# Patient Record
Sex: Male | Born: 1950 | Race: Black or African American | Hispanic: No | Marital: Married | Smoking: Never smoker
Health system: Southern US, Community
[De-identification: ages and names within clinical notes are randomized; demographics above are authoritative.]

## PROBLEM LIST (undated history)

## (undated) DIAGNOSIS — I10 Essential (primary) hypertension: Secondary | ICD-10-CM

## (undated) DIAGNOSIS — K219 Gastro-esophageal reflux disease without esophagitis: Secondary | ICD-10-CM

## (undated) HISTORY — PX: OTHER SURGICAL HISTORY: SHX169

## (undated) HISTORY — DX: Gastro-esophageal reflux disease without esophagitis: K21.9

## (undated) HISTORY — DX: Essential (primary) hypertension: I10

---

## 2020-07-26 ENCOUNTER — Encounter: Payer: Self-pay | Admitting: Neurology

## 2020-10-29 ENCOUNTER — Other Ambulatory Visit: Payer: Self-pay

## 2020-10-29 ENCOUNTER — Encounter: Payer: Self-pay | Admitting: Neurology

## 2020-10-29 ENCOUNTER — Other Ambulatory Visit (INDEPENDENT_AMBULATORY_CARE_PROVIDER_SITE_OTHER): Payer: Medicare PPO

## 2020-10-29 ENCOUNTER — Ambulatory Visit: Payer: Medicare PPO | Admitting: Neurology

## 2020-10-29 VITALS — BP 150/86 | HR 62 | Resp 20 | Ht 73.0 in | Wt 206.0 lb

## 2020-10-29 DIAGNOSIS — R413 Other amnesia: Secondary | ICD-10-CM

## 2020-10-29 LAB — VITAMIN B12: Vitamin B-12: 390 pg/mL (ref 211–911)

## 2020-10-29 LAB — TSH: TSH: 1.79 u[IU]/mL (ref 0.35–4.50)

## 2020-10-29 NOTE — Patient Instructions (Addendum)
It was a pleasure to see you today at our office.   Recommendations:  Meds: Follow up after the results of the tests are in  Neurocognitive evaluation at our office MRI of the brain, the office will call you to arrange you appointment Check B12 and TSH at the lab  RECOMMENDATIONS FOR ALL PATIENTS WITH MEMORY PROBLEMS: 1. Continue to exercise (Recommend 30 minutes of walking everyday, or 3 hours every week) 2. Increase social interactions - continue going to [place] and enjoy social gatherings with friends and family 3. Eat healthy, avoid fried foods and eat more fruits and vegetables 4. Maintain adequate blood pressure, blood sugar, and blood cholesterol level. Reducing the risk of stroke and cardiovascular disease also helps promoting better memory. 5. Avoid stressful situations. Live a simple life and avoid aggravations. Organize your time and prepare for the next day in anticipation. 6. Sleep well, avoid any interruptions of sleep and avoid any distractions in the bedroom that may interfere with adequate sleep quality 7. Avoid sugar, avoid sweets as there is a strong link between excessive sugar intake, diabetes, and cognitive impairment We discussed the Mediterranean diet, which has been shown to help patients reduce the risk of progressive memory disorders and reduces cardiovascular risk. This includes eating fish, eat fruits and green leafy vegetables, nuts like almonds and hazelnuts, walnuts, and also use olive oil. Avoid fast foods and fried foods as much as possible. Avoid sweets and sugar as sugar use has been linked to worsening of memory function.  There is always a concern of gradual progression of memory problems. If this is the case, then we may need to adjust level of care according to patient needs. Support, both to the patient and caregiver, should then be put into place.      You have been referred for a neuropsychological evaluation (i.e., evaluation of memory and thinking  abilities). Please bring someone with you to this appointment if possible, as it is helpful for the doctor to hear from both you and another adult who knows you well. Please bring eyeglasses and hearing aids if you wear them.    The evaluation will take approximately 3 hours and has two parts:   . The first part is a clinical interview with the neuropsychologist (Dr. Milbert Coulter or Dr. Roseanne Reno). During the interview, the neuropsychologist will speak with you and the individual you brought to the appointment.    . The second part of the evaluation is testing with the doctor's technician Annabelle Harman or Selena Batten). During the testing, the technician will ask you to remember different types of material, solve problems, and answer some questionnaires. Your family member will not be present for this portion of the evaluation.   Please note: We must reserve several hours of the neuropsychologist's time and the psychometrician's time for your evaluation appointment. As such, there is a No-Show fee of $100. If you are unable to attend any of your appointments, please contact our office as soon as possible to reschedule.    FALL PRECAUTIONS: Be cautious when walking. Scan the area for obstacles that may increase the risk of trips and falls. When getting up in the mornings, sit up at the edge of the bed for a few minutes before getting out of bed. Consider elevating the bed at the head end to avoid drop of blood pressure when getting up. Walk always in a well-lit room (use night lights in the walls). Avoid area rugs or power cords from appliances in the middle  of the walkways. Use a walker or a cane if necessary and consider physical therapy for balance exercise. Get your eyesight checked regularly.  FINANCIAL OVERSIGHT: Supervision, especially oversight when making financial decisions or transactions is also recommended.  HOME SAFETY: Consider the safety of the kitchen when operating appliances like stoves, microwave oven, and  blender. Consider having supervision and share cooking responsibilities until no longer able to participate in those. Accidents with firearms and other hazards in the house should be identified and addressed as well.   ABILITY TO BE LEFT ALONE: If patient is unable to contact 911 operator, consider using LifeLine, or when the need is there, arrange for someone to stay with patients. Smoking is a fire hazard, consider supervision or cessation. Risk of wandering should be assessed by caregiver and if detected at any point, supervision and safe proof recommendations should be instituted.  MEDICATION SUPERVISION: Inability to self-administer medication needs to be constantly addressed. Implement a mechanism to ensure safe administration of the medications.   DRIVING: Regarding driving, in patients with progressive memory problems, driving will be impaired. We advise to have someone else do the driving if trouble finding directions or if minor accidents are reported. Independent driving assessment is available to determine safety of driving.   If you are interested in the driving assessment, you can contact the following:  [employer] in [place] [phone]  [facility] [phone]  [facility] [phone]  [facility] [phone] or [phone]    Mediterranean Diet A Mediterranean diet refers to food and lifestyle choices that are based on the traditions of countries located on the [place]. This way of eating has been shown to help prevent certain conditions and improve outcomes for people who have chronic diseases, like kidney disease and heart disease. What are tips for following this plan? Lifestyle   Cook and eat meals together with your family, when possible.  Drink enough fluid to keep your urine clear or pale yellow.  Be physically active every day. This includes:  Aerobic exercise like running or  swimming.  Leisure activities like gardening, walking, or housework.  Get 7-8 hours of sleep each night.  If recommended by your health care provider, drink red wine in moderation. This means 1 glass a day for nonpregnant women and 2 glasses a day for men. A glass of wine equals 5 oz (150 mL). Reading food labels   Check the serving size of packaged foods. For foods such as rice and pasta, the serving size refers to the amount of cooked product, not dry.  Check the total fat in packaged foods. Avoid foods that have saturated fat or trans fats.  Check the ingredients list for added sugars, such as corn syrup. Shopping   At the grocery store, buy most of your food from the areas near the walls of the store. This includes:  Fresh fruits and vegetables (produce).  Grains, beans, nuts, and seeds. Some of these may be available in unpackaged forms or large amounts (in bulk).  Fresh seafood.  Poultry and eggs.  Low-fat dairy products.  Buy whole ingredients instead of prepackaged foods.  Buy fresh fruits and vegetables in-season from local farmers markets.  Buy frozen fruits and vegetables in resealable bags.  If you do not have access to quality fresh seafood, buy precooked frozen shrimp or canned fish, such as tuna, salmon, or sardines.  Buy small amounts of raw or cooked vegetables, salads, or olives from the deli or salad  bar at your store.  Stock your pantry so you always have certain foods on hand, such as olive oil, canned tuna, canned tomatoes, rice, pasta, and beans. Cooking   Cook foods with extra-virgin olive oil instead of using butter or other vegetable oils.  Have meat as a side dish, and have vegetables or grains as your main dish. This means having meat in small portions or adding small amounts of meat to foods like pasta or stew.  Use beans or vegetables instead of meat in common dishes like chili or lasagna.  Experiment with different cooking methods. Try  roasting or broiling vegetables instead of steaming or sauteing them.  Add frozen vegetables to soups, stews, pasta, or rice.  Add nuts or seeds for added healthy fat at each meal. You can add these to yogurt, salads, or vegetable dishes.  Marinate fish or vegetables using olive oil, lemon juice, garlic, and fresh herbs. Meal planning   Plan to eat 1 vegetarian meal one day each week. Try to work up to 2 vegetarian meals, if possible.  Eat seafood 2 or more times a week.  Have healthy snacks readily available, such as:  Vegetable sticks with hummus.  Greek yogurt.  Fruit and nut trail mix.  Eat balanced meals throughout the week. This includes:  Fruit: 2-3 servings a day  Vegetables: 4-5 servings a day  Low-fat dairy: 2 servings a day  Fish, poultry, or lean meat: 1 serving a day  Beans and legumes: 2 or more servings a week  Nuts and seeds: 1-2 servings a day  Whole grains: 6-8 servings a day  Extra-virgin olive oil: 3-4 servings a day  Limit red meat and sweets to only a few servings a month What are my food choices?  Mediterranean diet  Recommended  Grains: Whole-grain pasta. Brown rice. Bulgar wheat. Polenta. Couscous. Whole-wheat bread. Orpah Cobb.  Vegetables: Artichokes. Beets. Broccoli. Cabbage. Carrots. Eggplant. Green beans. Chard. Kale. Spinach. Onions. Leeks. Peas. Squash. Tomatoes. Peppers. Radishes.  Fruits: Apples. Apricots. Avocado. Berries. Bananas. Cherries. Dates. Figs. Grapes. Lemons. Melon. Oranges. Peaches. Plums. Pomegranate.  Meats and other protein foods: Beans. Almonds. Sunflower seeds. Pine nuts. Peanuts. Cod. Salmon. Scallops. Shrimp. Tuna. Tilapia. Clams. Oysters. Eggs.  Dairy: Low-fat milk. Cheese. Greek yogurt.  Beverages: Water. Red wine. Herbal tea.  Fats and oils: Extra virgin olive oil. Avocado oil. Grape seed oil.  Sweets and desserts: [place] yogurt with honey. Baked apples. Poached pears. Trail mix.  Seasoning and  other foods: Basil. Cilantro. Coriander. Cumin. Mint. Parsley. Sage. Rosemary. Tarragon. Garlic. Oregano. Thyme. Pepper. Balsalmic vinegar. Tahini. Hummus. Tomato sauce. Olives. Mushrooms.  Limit these  Grains: Prepackaged pasta or rice dishes. Prepackaged cereal with added sugar.  Vegetables: Deep fried potatoes (french fries).  Fruits: Fruit canned in syrup.  Meats and other protein foods: Beef. Pork. Lamb. Poultry with skin. Hot dogs. Tomasa Blase.  Dairy: Ice cream. Sour cream. Whole milk.  Beverages: Juice. Sugar-sweetened soft drinks. Beer. Liquor and spirits.  Fats and oils: Butter. Canola oil. Vegetable oil. Beef fat (tallow). Lard.  Sweets and desserts: Cookies. Cakes. Pies. Candy.  Seasoning and other foods: Mayonnaise. Premade sauces and marinades.  The items listed may not be a complete list. Talk with your dietitian about what dietary choices are right for you. Summary  The Mediterranean diet includes both food and lifestyle choices.  Eat a variety of fresh fruits and vegetables, beans, nuts, seeds, and whole grains.  Limit the amount of red meat and sweets that you  eat.  Talk with your health care provider about whether it is safe for you to drink red wine in moderation. This means 1 glass a day for nonpregnant women and 2 glasses a day for men. A glass of wine equals 5 oz (150 mL). This information is not intended to replace advice given to you by your health care provider. Make sure you discuss any questions you have with your health care provider. Document Released: 01/30/2016 Document Revised: 03/03/2016 Document Reviewed: 01/30/2016 Elsevier Interactive Patient Education  2017 [employer].     We have sent a referral to [facility] for your MRI and they will call you directly to schedule your appointment. They are located at [address] [facility]. If you need to contact them directly please call [phone].   Your provider has requested that you have  labwork completed today. Please go to [facility] ([id number]) on the second floor of this building before leaving the office today. You do not need to check in. If you are not called within 15 minutes please check with the front desk.

## 2020-10-29 NOTE — Progress Notes (Addendum)
Assessment/Plan:    Memory Loss  Brandon Copeland is a very pleasant 70 year old right-handed man referred to our office for evaluation of memory loss. At this time, further work-up will be needed to determine the causes of his memory loss, in order to proceed with plan. MoCA today 14/30   Recommendations are as follows . MRI of the brain to evaluate for bleeding, brain size and other abnormalities . Neurocognitive testing to further determine what causes memory changes including sleep, stress, anxiety depression . Will order B12 TSH . Discussed safety both in and out of the home. Discussed the importance of regular daily schedule with inclusion of crossword puzzles to maintain brain function.  . Monitor mood with PCP.  Marland Kitchen Continue to stay active exercising  at least 30 minutes at least 3 times a week.  . Naps should be scheduled and should be no longer than 60 minutes and should not occur after 2 PM.  . Mediterranean diet is recommended    Subjective:    The patient is seen in neurologic consultation at the request of [name], MD from [facility] for the evaluation of memory.  The patient is accompanied by wife Brandon Copeland who supplements the history.  The patient is a 70 y.o. year old male with a history of hypertension, hyperlipidemia, chronic gastritis without bleeding, prostate enlargement per patient report, who has had memory issues for about 2 years after going out of town and not remembering where he was, becoming very nervous;wife had to pick him up.  He has several similar episodes over the next year, for which his son began to be his "copilot".  There were no reported MVAs in the recent year or head trauma.  His wife of 40 years also noticed repetitive questions, or telling the same stories. She says that is "better to go along with this plan, from team as he may become more irritable".  She also noticed that he becomes fixated on one task, and he will not do  another one till finishing the first.  For example, he enjoys mowing the grass, "will not do anything else".  This behavior has been fairly new over the last year.  He is otherwise able to perform his own ADLs.  He manages his own medications not forgetting or overdosing. He showers and dresses by himself without help.   The patient continues to do his finances.  He cooks but has been leaving stove on for the last year.  No difficulty remembering, and recipes.  He does not smoke or drink.  His appetite is normal.  No difficulty swallowing. He sleeps a significant amount of time and takes frequent naps during the day.  He denies any history of sleep apnea.  He denies any vivid dreams or sleepwalking. He is also worried about his erectile dysfunction, apparently related to prostate issues.  His PSA has been elevated, and he is to follow-up with urology regarding this, but he is "very upset and embarrassed that he has premature ejaculation for the last year, if at all".  He has urinary issues, sometimes retention and sometimes nocturia, he also has urine incontinence during the day.  He walks without any walker or cane. 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  Family history remarkable for one paternal aunt with Alzheimer's disease.  Denies headaches, injuries to the head, double vision, numbness or tingling or unilateral weakness. Denies tremors.  Allergies  Allergen Reactions  . Codeine Other (See Comments)    Current Outpatient Medications  Medication Instructions  . famotidine (PEPCID) 20 MG tablet 1 tablet at bedtime as needed  . tamsulosin (FLOMAX) 0.4 MG CAPS capsule 1 capsule     VITALS:   Vitals:   10/29/20 0846  BP: (!) 150/86  Pulse: 62  Resp: 20  SpO2: 97%  Weight: 206 lb (93.4 kg)  Height: 6\' 1"  (1.854 m)   No flowsheet data found.  HEENT:  Normocephalic, atraumatic. The mucous membranes are moist. The superficial temporal arteries are  without ropiness or tenderness. Cardiovascular: Regular rate and rhythm. Lungs: Clear to auscultation bilaterally. Neck: There are no carotid bruits noted bilaterally.  NEUROLOGICAL:  Orientation:   Montreal Cognitive Assessment  10/29/2020  Visuospatial/ Executive (0/5) 4  Naming (0/3) 3  Attention: Read list of digits (0/2) 2  Attention: Read list of letters (0/1) 0  Attention: Serial 7 subtraction starting at 100 (0/3) 0  Language: Repeat phrase (0/2) 1  Language : Fluency (0/1) 0  Abstraction (0/2) 0  Delayed Recall (0/5) 2  Orientation (0/6) 2  Total 14   Fluency is decreased as well as substraction and reading letters, abstraction.  Cranial nerves: There is good facial symmetry. Extraocular muscles are intact and visual fields are full to confrontational testing. Speech is fluent and clear. Soft palate rises symmetrically and there is no tongue deviation. Hearing is intact to conversational tone. Tone: Tone is good throughout. Sensation: Sensation is intact to light touch and pinprick throughout. Vibration is intact at the bilateral big toe. There is no extinction with double simultaneous stimulation. There is no sensory dermatomal level identified. Coordination:  The patient has no difficulty with RAM's or FNF bilaterally. Motor: Strength is 5/5 in the bilateral upper and lower extremities. There is no pronator drift.  There are no fasciculations noted. DTR's: Deep tendon reflexes are 2/4 at the bilateral biceps, triceps, brachioradialis, patella and achilles.  Plantar responses are downgoing bilaterally. Gait and Station: The patient is able to ambulate without difficulty. The patient is able to heel toe walk without any difficulty. The patient is able to ambulate in a tandem fashion. The patient is able to stand in the Romberg position.      Total time spent on today's visit was 60 minutes, including both face-to-face time and nonface-to-face time.  Time included that spent  on review of records (prior notes available to me/labs/imaging if pertinent), discussing treatment and goals, answering patient's questions and coordinating care.  Cc:  12/29/2020, MD  [name] 10/29/2020 10:27 AM

## 2020-10-29 NOTE — Progress Notes (Signed)
Patient was seen, evaluated, and treatment plan was discussed with the Advanced Practice Provider. He appears to overall manage complex tasks well however MOCA score today 14/30. He also reports increasing frustration. Neurocognitive testing will be helpful to further evaluate cognitive concerns. MRI brain will be ordered to rule out structural abnormality and assess vascular load. I have also reviewed the orders written for this patient which were under my direction. I agree with the findings and the plan of care as documented by the Advanced Practice Provider.

## 2020-11-05 NOTE — Progress Notes (Signed)
MRI of the Brain PA approved for 11/05/20 until 12/05/20 ref number [id number] copy of approval is being faxed to the office

## 2020-11-12 ENCOUNTER — Other Ambulatory Visit: Payer: Medicare PPO

## 2020-11-15 ENCOUNTER — Other Ambulatory Visit: Payer: Self-pay

## 2020-11-15 ENCOUNTER — Other Ambulatory Visit: Payer: Self-pay | Admitting: Neurology

## 2020-11-15 ENCOUNTER — Ambulatory Visit
Admission: RE | Admit: 2020-11-15 | Discharge: 2020-11-15 | Disposition: A | Discharge status: Home / Self Care | Payer: Medicare PPO | Source: Ambulatory Visit | Attending: Neurology | Admitting: Neurology

## 2020-11-15 DIAGNOSIS — Z77018 Contact with and (suspected) exposure to other hazardous metals: Secondary | ICD-10-CM

## 2020-12-11 NOTE — Progress Notes (Signed)
Patient advised of results.

## 2021-01-17 ENCOUNTER — Encounter: Payer: Self-pay | Admitting: Counselor

## 2021-01-17 ENCOUNTER — Other Ambulatory Visit: Payer: Self-pay

## 2021-01-17 ENCOUNTER — Ambulatory Visit: Payer: Medicare PPO

## 2021-01-17 ENCOUNTER — Ambulatory Visit (INDEPENDENT_AMBULATORY_CARE_PROVIDER_SITE_OTHER): Payer: Medicare PPO | Admitting: Counselor

## 2021-01-17 DIAGNOSIS — F09 Unspecified mental disorder due to known physiological condition: Secondary | ICD-10-CM

## 2021-01-17 DIAGNOSIS — G3184 Mild cognitive impairment, so stated: Secondary | ICD-10-CM

## 2021-01-17 DIAGNOSIS — F8081 Childhood onset fluency disorder: Secondary | ICD-10-CM

## 2021-01-17 NOTE — Progress Notes (Signed)
NEUROPSYCHOLOGICAL EVALUATION [facility]  Patient Name: Brandon Copeland MRN: [id number] Date of Birth: 1951/02/26 Age: 70 y.o. Education: 12 years  Clinical Impressions  Torion Hulgan is a 70 y.o., right-hand dominant, married man with a history of HTN, HLD, chronic gastritis, and memory changes. His wife is concerned about changes in cognition and notes difficulties navigating (at times) over the past two years and short term memory loss. The patient presented with his son, who works with him 12 or more hours a day and has also noticed changes but he thinks they are subtle and simply due to old age. The patient himself acknowledges changes but thinks they are mild and not of particular concern. He has an MRI that shows normal brain morphology and volume for age and no concerning vascular or other lesions.   On neuropsychological assessment, there is evidence of notable difficulties in academic skills with word reading and spelling both at an extremely low level. He demonstrated average overall ability on the RIST index although there was a notable discrepancy between constituent subtests with an unusually low verbal score and an average visual score. These findings are suggestive of significant limitations in intellectually-related academic skills and may point to a learning disorder, limited education quality, or other factors. They substantially temper expectations for cognitive test performance. On cognitive tests, there were low scores in numerous areas but given the above noted issues it is difficult to say the extent to which these actually represent a decline. There is no indication of a memory storage problem, delayed recall was in fact his second best area of performance at an index level and he is benefiting from recognition cuing. He also had grossly intact visual object confrontation naming and semantic as compared to phonemic fluency (these are findings often specific to Alzheimer's  type presentations). He screened in the minimal range for depressive symptoms and the mild range for anxiety symptoms. His son characterized him as functioning at not even an MCI level of problem.   Mr. Ehmke is thus manifesting very significant premorbid limitations in academic skills that are likely to be developmental in nature and greatly confound interpretation of test performance. Mental status screening is likely to be misleading in the setting of these difficulties, particularly on more challenging measures such as the MoCA/SLUMS, would recommend use of the MMSE in the future. He may have some mild degree of cognitive impairment although I do not feel that the evidence is compelling enough for confident diagnosis given the patient's preexisting issues. He does not appear to have a clear memory storage problem. I will elicit further information from his wife during the follow up but his son rates him as functioning at a normal level.   Diagnostic Impressions: Learning disorder Memory loss  Recommendations to be discussed with patient  Your performance and presentation on assessment today were consistent with low scores on measures of academic skills including reading and writing. This is probably not surprising, given that you reported having difficulties with math and reading previously. It is possible that these scores are due to a learning disorder, they may also be due to limited education quantity. In any event, they significantly temper expectations for your cognitive test performance. It is possible that you have a mild level of decline beyond that but if so, it is mild. Your son rated you as functioning at a normal level and I was not able to speak with your wife, so I have not awarded a formal cognitive diagnosis.  Old age is associated with measurable changes in cognitive abilities over time but these changes should not interfere appreciably with ones cognitive functioning. If you are  starting to have a hard time with more complex things, to have memory loss significantly affect your ability to care for yourself or other major role responsibilities, then it is time to seek reevaluation because it may be a sign of a problem.  Some cognitive abilities (such as processing speed) naturally decline with age, but there are many things you can do to contribute to healthy cognitive aging. There is evidence from at least one study that a modified low carbohydrate mediterranean diet (the MIND-DASH) diet can contribute to healthy cognitive aging. There is also some evidence to suggest a beneficial effect of coffee (black coffee without sugar or other additives such as cream) for healthy aging. One glass of red wine a day may also contribute to healthy aging though at two drinks you lose all benefit and at three drinks it may be doing more harm than good. Staying active, mentally and physically, are also crucially important. One of the best ways to do this is simply to stay active and engaged in your life, particularly with social activities. Challenging the mind and other cognitively stimulating activities are encouraged, consider learning a new hobby, reconnecting with old friends, reading an interesting and thought provoking book. It is not so much what you do that is important as it is that you enjoy it and stay at it.   Of course, if you continue to experience decline you are welcome to come back and we now have baseline data that may be helpful for comparison in the future.    Test Findings  Test scores are summarized in additional documentation associated with this encounter. Test scores are relative to age, gender, and educational history as available and appropriate. There were no concerns about performance validity as all findings fell within normal expectations.   General Intellectual Functioning/Achievement:  Performance on single word reading was extremely low, equivalent to a 2.8th  grade level. Spelling was also extremely low, equivalent to a 3.3rd grade level. 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 On the RIST index he scored in the average range, although there was a pattern of relevant variability in his subtest performance. Performance on the more verbally mediated indicator was unusually low. Performance on the more visually oriented indicator was average.   Attention and Processing Efficiency: Performance on indicators of attention was variable, with low average digit repetition forward and extremely low timed number-symbol coding. His performance was also extremely low on simple numeric sequencing.   Language: Language findings showed intact low average range visual object confrontation naming. Generation of words was low average in response to the letters F-A-S and average in response to the category prompt "animals."   Visuospatial Function: Performance on visuospatial/constructional indicators was unusually low on the visuospatial/constructional index of the RBANS. Figure copy was unusually low and judgment of angular line orientations was unusually low. He had difficulty with even simple constructions including [place], which was extremely low. He had difficulty with the overlapping pentagons on the MMSE. Qualitative features, however, do not show any loss of gestalt. It appears to be more a planning problem and lack of attention to detail.   Learning and Memory: Performance on measures of learning and memory was notable for low scores, although considering overall performance he did better on delayed  recall than immediate recall, which is inconsistent with a storage problem. He also did seem to derive benefit from recognition cuing.   In the verbal realm, immediate recall for material including a 10-item word list and short story was extremely low. Delayed free  recall for this information was also extremely low, although he did retain just a bit of the short story. Delayed yes/no recognition for the words from the list versus foils was better and in the average range.   In the visual realm, delayed free recall for a modestly complex figure was unusually low.   Executive Functions: Performance on executive indicators was marginal, with an unusually low score on the [employer] Composite of the Modified [employer]. Reasoning with complex verbal information was extremely low. Alternating sequencing of numbers and letters of the alphabet was extremely low. Clock drawing was within normal limits, with only some minor errors in numbering.   Rating Scale(s): Mr. Retter reported a mild level of symptoms associated with depression and a subclinical level of symptoms associated with anxiety. His son rated him as functioning at a normal (not even mild cognitive impairment level) on the quick dementia rating system.   Bettye Boeck Roseanne Reno, PsyD, ABN Clinical Neuropsychologist  Coding and Compliance  Billing below reflects technician time, my direct face-to-face time with the patient, time spent in test administration, and time spent in professional activities including but not limited to: neuropsychological test interpretation, integration of neuropsychological test data with clinical history, report preparation, treatment planning, care coordination, and review of diagnostically pertinent medical history or studies.   Services associated with this encounter: Clinical Interview [phone]) plus 120 minutes (96132/96133; Neuropsychological Evaluation by Professional)  25 minutes (96136/96137; Test Administration by Professional) 130 minutes (96138/96139; Neuropsychological Testing by Technician)

## 2021-01-17 NOTE — Progress Notes (Signed)
NEUROPSYCHOLOGICAL TEST SCORES [facility]  Patient Name: Brandon Copeland MRN: [id number] Date of Birth: [birth year] Age: 70 y.o. Education: 12 years  Measurement properties of test scores: IQ, Index, and Standard Scores (SS): Mean = 100; Standard Deviation = 15 Scaled Scores (Ss): Mean = 10; Standard Deviation = 3 Z scores (Z): Mean = 0; Standard Deviation = 1 T scores (T); Mean = 50; Standard Deviation = 10  TEST SCORES:    Note: This summary of test scores accompanies the interpretive report and should not be interpreted by unqualified individuals or in isolation without reference to the report. Test scores are relative to age, gender, and educational history as available and appropriate.   Performance Validity        The Dot Counting Test: Raw Descriptor      E-Score 19 Below Expectation  "A" Random Letter Test Errors 0 Within Expectation      Embedded Measures: Raw Descriptor      RBANS Effort Index: 0 Within Expectation      Mental Status Screening     Total Score Descriptor  MMSE 25 MCI      Expected Functioning        Wide Range Achievement Test (Word Reading): Standard/Scaled Score Percentile       Word Reading 65 1       Spelling 60 1      Reynolds Intellectual Screening Test Standard/T-score Percentile      Guess What 35 7      Odd Item Out 50 50  RIST Index 90 25      Cognitive Testing        RBANS, Form : Standard/Scaled Score Percentile  Total Score 68 2  Immediate Memory 57 <1      List Learning 4 2      Story Memory 2 <1  Visuospatial/Constructional 72 3      Figure Copy   (16) 6 9      Judgment of Line Orientation   (10) --- 3-9  Language 92 30      Picture Naming --- 17-25      Semantic Fluency 7 16  Attention 68 2      Digit Span 7 16      Coding 3 1  Delayed Memory 84 14      List Recall   (0) --- <2      List Recognition   (19) --- 26-50      Story Recall   (4) 4 2      Figure Recall   (6) 5 5      Neuropsychological  Assessment Battery (Language Module): T-score Percentile      Naming   (28) 39 14      Verbal Fluency: T-score Percentile      Controlled Oral Word Association (F-A-S) 42 21      Semantic Fluency (Animals) 51 54      Trail Making Test: T-Score Percentile      Part A 26 1      Part B 28 2      Modified [place] Card Sorting Test (MWCST): Standard/T-Score Percentile      Number of Categories Correct 29 2      Number of Perseverative Errors 37 9      Number of Total Errors 37 9      Percent Perseverative Errors 44 27  Executive Function Composite 72 3      Boston Diagnostic Aphasia Exam: Raw  Score Scaled Score      Complex Ideational Material 7 2      Clock Drawing Raw Score Descriptor      Command 9 WNL      [place] Cross Drawing [place]) T-Score Percentile      Cross Copy 17 <1      Rating Scales         Raw Score Descriptor  Patient Health Questionnaire - 9 0 Within Normal Limits  GAD-7 6 Mild      Quick Dementia Rating System Raw Score Descriptor      Sum of Boxes 1 MCI      Total Score 1 Normal   Jashiya Bassett V. Roseanne Reno PsyD, ABN Clinical Neuropsychologist

## 2021-01-17 NOTE — Progress Notes (Signed)
   Psychometrist Note   Cognitive testing was administered to [facility] by Brandon Copeland, B.S. Environmental education officer) under the supervision of Brandon Copeland, Psy.D., ABN. Brandon Copeland was able to tolerate all test procedures. Dr. Nicole Kindred met with the patient as needed to manage any emotional reactions to the testing procedures. Rest breaks were offered.    The battery of tests administered was selected by Dr. Nicole Kindred with consideration to the patient's current level of functioning, the nature of his symptoms, emotional and behavioral responses during the interview, level of literacy, observed level of motivation/effort, and the nature of the referral question. This battery was communicated to the psychometrist. Communication between Dr. Nicole Kindred and the psychometrist was ongoing throughout the evaluation and Dr. Nicole Kindred was immediately accessible at all times. Dr. Nicole Kindred provided supervision to the technician on the date of this service, to the extent necessary to assure the quality of all services provided.    Brandon Copeland will return in approximately one week for an interactive feedback session with Dr. Nicole Kindred, at which time test performance, clinical impressions, and treatment recommendations will be reviewed in detail. The patient understands he can contact our office should he require our assistance before this time.   A total of 130 minutes of billable time were spent with Brandon Copeland by the technician, including test administration and scoring time. Billing for these services is reflected in Dr. Les Pou note.   This note reflects time spent with the psychometrician and does not include test scores, clinical history, or any interpretations made by Dr. Nicole Kindred. The full report will follow in a separate note.

## 2021-01-17 NOTE — Progress Notes (Signed)
NEUROPSYCHOLOGICAL EVALUATION [facility]  Patient Name: Brandon Copeland MRN: [id number] Date of Birth: Apr 28, 1951 Age: 70 y.o. Education: 12 years  Referral Circumstances and Background Information  [name] is a 70 y.o., right-hand dominant, married man with a history of HTN, HLD, chronic gastritis, and memory changes. He was seen recently by Marlowe Kays, PA-C who demonstrated a MoCA of 14/30 but noted intact functional status. The notes reveal that the patient has been having difficulties navigating when driving over the past year, telling the same stories and asking repetitive questions, and he will also get fixated on things such as mowing the lawn.   The patient presented to the clinic with his son today. Unfortunately, due to COVID restrictions, his son could not come back (he had a child with him) so we completed the interview alone. I was able to give his son an informant report severity-status staging questionnaire to fill out and to talk with him briefly, he is not very concerned but has noticed some mild memory loss, which he attributes to old age. The patient stated that his wife is the main one concerned about his memory and thinking. In terms of symptoms he notices, he will put things down and not remember where he placed them more than in the past. He also acknowledged occasionally forgetting conversations that he has had. He stated that his wife says he repeats himself but he doesn't notice it. Sometimes, he recognizes that he has been told things when he is reminded and other times he does not. According to the patient's wife, the issues have been going on several years but he thinks it has been about a year. He acknowledged having word finding problems but he always has had those and it's not a change. He also has a stutter but that is not a change. He denied losing track of the month or the year. He will get confused about the day of the week, from time to time. I  asked him specifically about the behavior changes noticed by his wife, that he gets stuck on one thing and won't move on until he finishes. He stated he has always been like that and likes to get things done before moving on to the next task. With respect to mood, the patient stated that he is happy and he enjoys his job and working outside. His energy is good. 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 His appetite is stable and his weight is normal for him. He sleeps adequately and perhaps excessively, getting a good nights sleep and then sleeping throughout the day.   With respect to finances, the patient was previously managing them all but his wife took over bill payment after she retired, which was 5 or 6 years ago. 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 He cooks food and has no difficulties doing so. He does things around the house like washing, ironing, and doing his clothes. 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 The patient denied that he is getting lost when driving, he stated that he will drive past turns sometimes and then realize he needs to turn around. This  is a bit discrepant from previous report during their visit with Ms. Wertman. He denied any problems around the house and is handy and fixes things. He essentially has no hobbies, he mainly works.   Past Medical History and Review of Relevant Studies  There are no problems to display for this patient.   Review of Neuroimaging and Relevant Medical History: The patient has an MRI brain from 11/15/2020 that shows normal volume and morphology for age. There is mild, likely involutional change. No significant areas of leukoaraiosis though there  is one minimal focus in the right frontal lobe as noted by radiology.   Current Outpatient Medications  Medication Sig Dispense Refill   famotidine (PEPCID) 20 MG tablet 1 tablet at bedtime as needed     tamsulosin (FLOMAX) 0.4 MG CAPS capsule 1 capsule     No current facility-administered medications for this visit.   Family History  Problem Relation Age of Onset   Heart attack Father    Alzheimer's disease Paternal Aunt    There is a family history of dementia. The patient has a paternal aunt with Alzheimer's, he wasn't sure how old she was, maybe in her 9s or 85s. He denied any other history. There is no  family history of psychiatric illness.  Psychosocial History  Developmental, Educational and Employment History: The patient is a native of [place] and has lived there all his life. The patient was vague about his performance in school. He stated that he wasn't the "smartest kid in school," but then was a bit cagey about how he did. He said he earned A's, B's, C's, and D's. He has a stutter and stated that he has a hard time getting his words out and that affected him in school. He denied ever being held back. He denied failing any classes. He stated that he is not very good at math and reading. 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 He still works full time with a Radio broadcast assistant.   Psychiatric History: The patient denied any history of depression or mental health issues.   Substance Use History: The patient doesn't drink and does not use nicotine.   Relationship History and Living Cimcumstances: The patient and his wife have been married for about 39 years. They have two children, a son and a daughter. He works with his son. His son was able to fill out the QDRS.   Mental Status and Behavioral Observations  Sensorium/Arousal: The patient's level of arousal was awake and alert. Hearing  and vision were adequate for testing purposes. Orientation: The patient was oriented to person, place, time, and situation.  Appearance: Dressed in appropriate, casual clothing Behavior: Compliant with interview and testing I administered with him. Did get quite agitated when asked to draw even simple things. As per technician, he was visibly upset initially during testing but then calmed down after taking a break and was able to get through the tests planned.  Speech/language: Speech was notable for a stutter and frequent mispronunciation of words, which may be related to premorbid knowledge or speech problems. He had no word finding problems or paraphasic errors.  Gait/Posture: Not formally examined Movement: No Parkinsonism with Ms. Wertman Social Comportment: Pleasant, appropriate Mood: "Good, I love being outside and I work outside" Affect: Euthymic, did become quite agitated when asked to draw things during testing I completed with him Thought process/content: The patient's  thought process was logical and goal-oriented although he had a hard time with abstraction and often interpreted things literally. Thought content was appropriate Safety: No safety concerns identified in this euthymic individual  Insight: Fair  MMSE - Mini Mental State Exam 01/17/2021  Orientation to time 5  Orientation to Place 4  Registration 3  Attention/ Calculation 4  Recall 2  Language- name 2 objects 2  Language- repeat 1  Language- follow 3 step command 2  Language- read & follow direction 1  Write a sentence 1  Copy design 0  Total score 25   Test Procedures  Wide Range Achievement Test - 4             Word Reading  Spelling Neuropsychological Assessment Battery  Naming Repeatable Battery for the Assessment of Neuropsychological Status (Form A) The Dot Counting Test A Random Letter Test Controlled Oral Word Association (F-A-S) Semantic Fluency (Animals) Trail Making Test A & B Modified  [place] Card Sorting Test GAD-7 PHQ-9 Quick Dementia Rating System (completed by son, Debby Bud)  Plan  Brandon Copeland was seen for a psychiatric diagnostic evaluation and neuropsychological testing. He is a pleasant, 70 year old, right-hand dominant man. He presented to the clinic with his son today who has noticed some memory problems but thinks they are minor and attributed them to old age. 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 The patient's wife is concerned and I attempted to contact her but I was unable to reach her. Mr. Britten himself admits to some mild memory changes but is not particularly concerned. He has a stutter, had problems with reading and math, and I think that may be a big part of his performance on the MoCA with Ms. Wertman. He is scoring 25/30 on the MMSE today, which is probably near normal for him. Full and complete note with impressions, recommendations, and interpretation of test data to follow.   Bettye Boeck Roseanne Reno, PsyD, ABN Clinical Neuropsychologist  Informed Consent  Risks and benefits of the evaluation were discussed with the patient prior to all testing procedures. I conducted a clinical interview and neuropsychological testing (at least two tests) with Brandon Copeland and Clare Charon, B.S. (Technician) administered additional test procedures. The patient was able to tolerate the testing procedures and the patient (and/or family if applicable) is likely to benefit from further follow up to receive the diagnosis and treatment recommendations, which will be rendered at the next encounter.

## 2021-01-24 ENCOUNTER — Encounter: Payer: Medicare PPO | Admitting: Counselor

## 2021-01-31 ENCOUNTER — Ambulatory Visit: Payer: Medicare PPO | Admitting: Physician Assistant

## 2021-02-04 ENCOUNTER — Other Ambulatory Visit: Payer: Self-pay

## 2021-02-04 ENCOUNTER — Ambulatory Visit (INDEPENDENT_AMBULATORY_CARE_PROVIDER_SITE_OTHER): Payer: Medicare PPO | Admitting: Counselor

## 2021-02-04 ENCOUNTER — Encounter: Payer: Self-pay | Admitting: Counselor

## 2021-02-04 DIAGNOSIS — F819 Developmental disorder of scholastic skills, unspecified: Secondary | ICD-10-CM | POA: Diagnosis not present

## 2021-02-04 NOTE — Progress Notes (Signed)
   [facility]  Feedback Note: I met with Brandon Copeland to review the findings resulting from his neuropsychological evaluation. Since the last appointment, he is about the same. He disclosed how challenging it was for him to get through the testing at the last appointment. Completing these tasks was threatening for him because he has always had a hard time with academic things and was teased in school. Time was spent reviewing the impressions and recommendations that are detailed in the evaluation report. We discussed impression of likely learning disorder, and unclear issues beyond that. I shared with him that some decline is possible, although he in fact did quite well on tests that are specific for AD and on measures of delayed recall (which are probably the most sensitive). Other topics of discussion as reflected in the patient instructions. I took time to explain the findings and answer all the patient's questions. I encouraged Brandon Copeland to contact me should he have any further questions or if further follow up is desired.   Current Medications and Medical History   Current Outpatient Medications  Medication Sig Dispense Refill   famotidine (PEPCID) 20 MG tablet 1 tablet at bedtime as needed     tamsulosin (FLOMAX) 0.4 MG CAPS capsule 1 capsule     No current facility-administered medications for this visit.    There are no problems to display for this patient.   Mental Status and Behavioral Observations  Brandon Copeland presented on time to the present encounter and was alert and generally oriented. Speech was normal in rate, rhythm, volume, and prosody. Self-reported mood was "Good, this takes a huge load off" and affect was euthymic. Thought process was logical and goal-directed and thought content was appropriate to the topics discussed. There were no safety concerns identified at today's encounter, such as thoughts of harming self or others.    Plan  Feedback provided regarding the patient's neuropsychological evaluation. He stated that the evaluation was very threatening to him because the tasks were challenging and he is not much of an academic, but he felt as though his sensitivities were handled with dignity and professionalism. He presented as very relieved regarding the test findings. Brandon Copeland was encouraged to contact me if any questions arise or if further follow up is desired.   Brandon Copeland Brandon Kindred, PsyD, ABN Clinical Neuropsychologist  Service(s) Provided at This Encounter: 25 minutes [phone]; Psychotherapy with patient/family)

## 2021-02-04 NOTE — Patient Instructions (Signed)
Your performance and presentation on assessment today were consistent with low scores on measures of academic skills including reading and writing. This is probably not surprising, given that you reported having difficulties with math and reading previously. It is possible that these scores are due to a learning disorder, they may also be due to limited education quantity. In any event, they significantly temper expectations for your cognitive test performance. It is possible that you have a mild level of decline beyond that but if so, it is mild. Your son rated you as functioning at a normal level and I was not able to speak with your wife, so I have not awarded a formal cognitive diagnosis.    Old age is associated with measurable changes in cognitive abilities over time but these changes should not interfere appreciably with ones cognitive functioning. If you are starting to have a hard time with more complex things, to have memory loss significantly affect your ability to care for yourself or other major role responsibilities, then it is time to seek reevaluation because it may be a sign of a problem.   Some cognitive abilities (such as processing speed) naturally decline with age, but there are many things you can do to contribute to healthy cognitive aging. There is evidence from at least one study that a modified low carbohydrate mediterranean diet (the MIND-DASH) diet can contribute to healthy cognitive aging. There is also some evidence to suggest a beneficial effect of coffee (black coffee without sugar or other additives such as cream) for healthy aging. One glass of red wine a day may also contribute to healthy aging though at two drinks you lose all benefit and at three drinks it may be doing more harm than good. Staying active, mentally and physically, are also crucially important. One of the best ways to do this is simply to stay active and engaged in your life, particularly with social  activities. Challenging the mind and other cognitively stimulating activities are encouraged, consider learning a new hobby, reconnecting with old friends, reading an interesting and thought provoking book. It is not so much what you do that is important as it is that you enjoy it and stay at it.    Of course, if you continue to experience decline you are welcome to come back and we now have baseline data that may be helpful for comparison in the future.

## 2021-02-11 ENCOUNTER — Ambulatory Visit: Payer: Medicare PPO | Admitting: Physician Assistant

## 2021-02-11 ENCOUNTER — Encounter: Payer: Self-pay | Admitting: Physician Assistant

## 2021-02-11 ENCOUNTER — Other Ambulatory Visit: Payer: Self-pay

## 2021-02-11 VITALS — BP 139/84 | HR 70 | Resp 18 | Ht 73.0 in | Wt 199.0 lb

## 2021-02-11 DIAGNOSIS — F819 Developmental disorder of scholastic skills, unspecified: Secondary | ICD-10-CM | POA: Diagnosis not present

## 2021-02-11 DIAGNOSIS — R413 Other amnesia: Secondary | ICD-10-CM

## 2021-02-11 NOTE — Progress Notes (Signed)
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  He may have some mild degree of cognitive impairment  but the evidence is compelling enough for confident diagnosis given the patient's preexisting issues. He does not appear to have a clear memory storage problem.  He also has a  mild level of symptoms associated with depression and a subclinical level of symptoms associated with anxiety. Cognitive behavioral therapy was offered by patient declined.    Recommendations:  Discussed safety both in and out of the home.  Discussed the importance of regular daily schedule with inclusion of crossword puzzles to maintain brain function.  Continue to monitor mood by PCP  Continue to stay active at least 30 minutes at least 3 times a week.  Naps should be scheduled and should be no longer than 60 minutes and should not occur after 2 PM.  Mediterranean diet is recommended  Follow up in 1 year    Case discussed with Dr. Karel Jarvis who agrees with the plan     Subjective:   ED visits since last seen: none  Hospital admissions: none  Brandon Copeland is a 70 y.o. male with a history of hypertension, hyperlipidemia, chronic gastritis without bleeding, prostate enlargement seen today in follow up for memory loss. This patient is accompanied in the office by his wife who supplements the history.  Previous records as well as any outside records available were reviewed prior to todays visit.  Patient is relieved to know that his memory issues are likely related to a learning disability and not Alzheimer's dementia . He repeatedly said" Thank you, Jesus".  He continues to able to perform his own ADLs.  He manages his own medications not forgetting or overdosing. He is independent of dressing and bathing. The patient continues to do his finances.   He cooks but has been leaving stove on for the last year.  No difficulty remembering common recipes.  He does not smoke or drink.  His appetite is normal without difficulty swallowing. He sleeps a significant amount of time and takes frequent naps during the day; denies any vivid dreams or sleepwalking, hallucinations or paranoia. Continues to have prostate issues, sometimes with nocturia. He walks without a walker or cane. He  drives without getting lost "because my son is the copilot".   Denies headaches, injuries to the head, double vision, numbness or tingling or unilateral weakness. Denies tremors.    MRI brain from 11/15/2020 that shows normal volume and morphology for age. There is mild, likely involutional change. No significant areas of leukoaraiosis though there is one minimal focus in the right frontal lobe as noted by radiology.      Initial Visit 10/29/20 The patient is seen in neurologic consultation at the request of Brandon Robert, MD from [facility] for the evaluation of memory.  The patient is accompanied by wife Brandon Copeland who supplements the history.  The patient is a 70 y.o. year old male with a history of hypertension, hyperlipidemia, chronic gastritis without bleeding, prostate enlargement per patient report, who has had memory issues for about 2 years after going out of town and not remembering where he was, becoming very nervous;wife had to pick him up.  He has several similar episodes over the next year, for which his son began to be his "copilot".  There  were no reported MVAs in the recent year or head trauma.  His wife of 40 years also noticed repetitive questions, or telling the same stories. She says that is "better to go along with this plan, from team as he may become more irritable".  She also noticed that he becomes fixated on one task, and he will not do another one till finishing the first.  For example, he enjoys mowing the grass, "will not do anything  else".  This behavior has been fairly new over the last year.  He is otherwise able to perform his own ADLs.  He manages his own medications not forgetting or overdosing. He showers and dresses by himself without help.   The patient continues to do his finances.  He cooks but has been leaving stove on for the last year.  No difficulty remembering, and recipes.  He does not smoke or drink.  His appetite is normal.  No difficulty swallowing. He sleeps a significant amount of time and takes frequent naps during the day.  He denies any history of sleep apnea.  He denies any vivid dreams or sleepwalking. He is also worried about his erectile dysfunction, apparently related to prostate issues.  His PSA has been elevated, and he is to follow-up with urology regarding this, but he is "very upset and embarrassed that he has premature ejaculation for the last year, if at all".  He has urinary issues, sometimes retention and sometimes nocturia, he also has urine incontinence during the day.  He walks without any walker or cane. 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  Family history remarkable for one paternal aunt with Alzheimer's disease.  Denies headaches, injuries to the head, double vision, numbness or tingling or unilateral weakness. Denies tremors.    Neurocognitive Test Dr. Roseanne Reno 01/17/21  "Mr. Grainger is thus manifesting very significant premorbid limitations in academic skills that are likely to be developmental in nature and greatly confound interpretation of test performance. Mental status screening is likely to be misleading in the setting of these difficulties, particularly on more challenging measures such as the MoCA/SLUMS, would recommend use of the MMSE in the future. He may have some mild degree of cognitive impairment although I do not feel that the evidence is compelling enough for confident diagnosis given the patient's preexisting issues. He does not appear to have a  clear memory storage problem. I will elicit further information from his wife during the follow up but his son rates him as functioning at a normal level.   ...Mr. Koral reported a mild level of symptoms associated with depression and a subclinical level of symptoms associated with anxiety. His son rated him as functioning at a normal (not even mild cognitive impairment level) on the quick dementia rating system. "     CURRENT MEDICATIONS:  Outpatient Encounter Medications as of 02/11/2021  Medication Sig   atorvastatin (LIPITOR) 80 MG tablet 1 tablet   famotidine (PEPCID) 20 MG tablet 1 tablet at bedtime as needed   tamsulosin (FLOMAX) 0.4 MG CAPS capsule 1 capsule   No facility-administered encounter medications on file as of 02/11/2021.     Objective:     PHYSICAL EXAMINATION:    VITALS:   Vitals:   02/11/21 0924  BP: 139/84  Pulse: 70  Resp: 18  SpO2: 98%  Weight: 199 lb (90.3 kg)  Height: 6\' 1"  (1.854 m)    GEN:  The patient appears stated age and is in  NAD. HEENT:  Normocephalic, atraumatic.   Neurological examination:  General: NAD, well-groomed, appears stated age. Orientation: The patient is alert. Oriented to person, place and not to date Cranial nerves: There is good facial symmetry.The speech is fluent and clear  No aphasia or dysarthria. Fund of knowledge is appropriate. Recent and remote memory are impaired. Attention and concentration are reduced.  Able to name objects and repeat phrases.  Hearing is intact to conversational tone.    Sensation: Sensation is intact to light touch throughout Motor: Strength is at least antigravity x4. Tremors: none  DTR's 2/4 in UE/LE     Montreal Cognitive Assessment  02/11/2021 10/29/2020  Visuospatial/ Executive (0/5) 0 4  Naming (0/3) 0 3  Attention: Read list of digits (0/2) 2 2  Attention: Read list of letters (0/1) 1 0  Attention: Serial 7 subtraction starting at 100 (0/3) 3 0  Language: Repeat phrase (0/2) 1 1   Language : Fluency (0/1) 1 0  Abstraction (0/2) 2 0  Delayed Recall (0/5) 0 2  Orientation (0/6) 0 2  Total 10 14   MMSE - Mini Mental State Exam 01/17/2021  Orientation to time 5  Orientation to Place 4  Registration 3  Attention/ Calculation 4  Recall 2  Language- name 2 objects 2  Language- repeat 1  Language- follow 3 step command 2  Language- read & follow direction 1  Write a sentence 1  Copy design 0  Total score 25    No flowsheet data found.     Movement examination: Tone: There is normal tone in the UE/LE Abnormal movements:  no tremor.  No myoclonus.  No asterixis.   Coordination:  There is no decremation with RAM's. Normal finger to nose  Gait and Station: The patient has no difficulty arising out of a deep-seated chair without the use of the hands. The patient's stride length is good.  Gait is cautious and narrow.        Total time spent on today's visit was 30 minutes, including both face-to-face time and nonface-to-face time. Time included that spent on review of records (prior notes available to me/labs/imaging if pertinent), discussing treatment and goals, answering patient's questions and coordinating care.  Cc:  Brandon Robert, MD Brandon Kays, PA-C

## 2021-02-11 NOTE — Patient Instructions (Signed)
It was a pleasure to see you today at our office.   Recommendations:  Follow up in 1 year   RECOMMENDATIONS FOR ALL PATIENTS WITH MEMORY PROBLEMS: 1. Continue to exercise (Recommend 30 minutes of walking everyday, or 3 hours every week) 2. Increase social interactions - continue going to Church and enjoy social gatherings with friends and family 3. Eat healthy, avoid fried foods and eat more fruits and vegetables 4. Maintain adequate blood pressure, blood sugar, and blood cholesterol level. Reducing the risk of stroke and cardiovascular disease also helps promoting better memory. 5. Avoid stressful situations. Live a simple life and avoid aggravations. Organize your time and prepare for the next day in anticipation. 6. Sleep well, avoid any interruptions of sleep and avoid any distractions in the bedroom that may interfere with adequate sleep quality 7. Avoid sugar, avoid sweets as there is a strong link between excessive sugar intake, diabetes, and cognitive impairment We discussed the Mediterranean diet, which has been shown to help patients reduce the risk of progressive memory disorders and reduces cardiovascular risk. This includes eating fish, eat fruits and green leafy vegetables, nuts like almonds and hazelnuts, walnuts, and also use olive oil. Avoid fast foods and fried foods as much as possible. Avoid sweets and sugar as sugar use has been linked to worsening of memory function.  There is always a concern of gradual progression of memory problems. If this is the case, then we may need to adjust level of care according to patient needs. Support, both to the patient and caregiver, should then be put into place.     FALL PRECAUTIONS: Be cautious when walking. Scan the area for obstacles that may increase the risk of trips and falls. When getting up in the mornings, sit up at the edge of the bed for a few minutes before getting out of bed. Consider elevating the bed at the head end to avoid  drop of blood pressure when getting up. Walk always in a well-lit room (use night lights in the walls). Avoid area rugs or power cords from appliances in the middle of the walkways. Use a walker or a cane if necessary and consider physical therapy for balance exercise. Get your eyesight checked regularly.  FINANCIAL OVERSIGHT: Supervision, especially oversight when making financial decisions or transactions is also recommended.  HOME SAFETY: Consider the safety of the kitchen when operating appliances like stoves, microwave oven, and blender. Consider having supervision and share cooking responsibilities until no longer able to participate in those. Accidents with firearms and other hazards in the house should be identified and addressed as well.   ABILITY TO BE LEFT ALONE: If patient is unable to contact 911 operator, consider using LifeLine, or when the need is there, arrange for someone to stay with patients. Smoking is a fire hazard, consider supervision or cessation. Risk of wandering should be assessed by caregiver and if detected at any point, supervision and safe proof recommendations should be instituted.  MEDICATION SUPERVISION: Inability to self-administer medication needs to be constantly addressed. Implement a mechanism to ensure safe administration of the medications.   DRIVING: Regarding driving, in patients with progressive memory problems, driving will be impaired. We advise to have someone else do the driving if trouble finding directions or if minor accidents are reported. Independent driving assessment is available to determine safety of driving.   If you are interested in the driving assessment, you can contact the following:  [employer] in [place] [phone]  Driver   Rehabilitative Services [phone]  [facility] [phone]  [facility] [phone] or [phone]    Mediterranean Diet A Mediterranean diet refers to food and  lifestyle choices that are based on the traditions of countries located on the [place] Sea. This way of eating has been shown to help prevent certain conditions and improve outcomes for people who have chronic diseases, like kidney disease and heart disease. What are tips for following this plan? Lifestyle  Cook and eat meals together with your family, when possible. Drink enough fluid to keep your urine clear or pale yellow. Be physically active every day. This includes: Aerobic exercise like running or swimming. Leisure activities like gardening, walking, or housework. Get 7-8 hours of sleep each night. If recommended by your health care provider, drink red wine in moderation. This means 1 glass a day for nonpregnant women and 2 glasses a day for men. A glass of wine equals 5 oz (150 mL). Reading food labels  Check the serving size of packaged foods. For foods such as rice and pasta, the serving size refers to the amount of cooked product, not dry. Check the total fat in packaged foods. Avoid foods that have saturated fat or trans fats. Check the ingredients list for added sugars, such as corn syrup. Shopping  At the grocery store, buy most of your food from the areas near the walls of the store. This includes: Fresh fruits and vegetables (produce). Grains, beans, nuts, and seeds. Some of these may be available in unpackaged forms or large amounts (in bulk). Fresh seafood. Poultry and eggs. Low-fat dairy products. Buy whole ingredients instead of prepackaged foods. Buy fresh fruits and vegetables in-season from local farmers markets. Buy frozen fruits and vegetables in resealable bags. If you do not have access to quality fresh seafood, buy precooked frozen shrimp or canned fish, such as tuna, salmon, or sardines. Buy small amounts of raw or cooked vegetables, salads, or olives from the deli or salad bar at your store. Stock your pantry so you always have certain foods on hand, such  as olive oil, canned tuna, canned tomatoes, rice, pasta, and beans. Cooking  Cook foods with extra-virgin olive oil instead of using butter or other vegetable oils. Have meat as a side dish, and have vegetables or grains as your main dish. This means having meat in small portions or adding small amounts of meat to foods like pasta or stew. Use beans or vegetables instead of meat in common dishes like chili or lasagna. Experiment with different cooking methods. Try roasting or broiling vegetables instead of steaming or sauteing them. Add frozen vegetables to soups, stews, pasta, or rice. Add nuts or seeds for added healthy fat at each meal. You can add these to yogurt, salads, or vegetable dishes. Marinate fish or vegetables using olive oil, lemon juice, garlic, and fresh herbs. Meal planning  Plan to eat 1 vegetarian meal one day each week. Try to work up to 2 vegetarian meals, if possible. Eat seafood 2 or more times a week. Have healthy snacks readily available, such as: Vegetable sticks with hummus. Greek yogurt. Fruit and nut trail mix. Eat balanced meals throughout the week. This includes: Fruit: 2-3 servings a day Vegetables: 4-5 servings a day Low-fat dairy: 2 servings a day Fish, poultry, or lean meat: 1 serving a day Beans and legumes: 2 or more servings a week Nuts and seeds: 1-2 servings a day Whole grains: 6-8 servings a day Extra-virgin olive oil: 3-4 servings a day   Limit red meat and sweets to only a few servings a month What are my food choices? Mediterranean diet Recommended Grains: Whole-grain pasta. Brown rice. Bulgar wheat. Polenta. Couscous. Whole-wheat bread. Oatmeal. Quinoa. Vegetables: Artichokes. Beets. Broccoli. Cabbage. Carrots. Eggplant. Green beans. Chard. Kale. Spinach. Onions. Leeks. Peas. Squash. Tomatoes. Peppers. Radishes. Fruits: Apples. Apricots. Avocado. Berries. Bananas. Cherries. Dates. Figs. Grapes. Lemons. Melon. Oranges. Peaches. Plums.  Pomegranate. Meats and other protein foods: Beans. Almonds. Sunflower seeds. Pine nuts. Peanuts. Cod. Salmon. Scallops. Shrimp. Tuna. Tilapia. Clams. Oysters. Eggs. Dairy: Low-fat milk. Cheese. Greek yogurt. Beverages: Water. Red wine. Herbal tea. Fats and oils: Extra virgin olive oil. Avocado oil. Grape seed oil. Sweets and desserts: Greek yogurt with honey. Baked apples. Poached pears. Trail mix. Seasoning and other foods: Basil. Cilantro. Coriander. Cumin. Mint. Parsley. Sage. Rosemary. Tarragon. Garlic. Oregano. Thyme. Pepper. Balsalmic vinegar. Tahini. Hummus. Tomato sauce. Olives. Mushrooms. Limit these Grains: Prepackaged pasta or rice dishes. Prepackaged cereal with added sugar. Vegetables: Deep fried potatoes (french fries). Fruits: Fruit canned in syrup. Meats and other protein foods: Beef. Pork. Lamb. Poultry with skin. Hot dogs. Bacon. Dairy: Ice cream. Sour cream. Whole milk. Beverages: Juice. Sugar-sweetened soft drinks. Beer. Liquor and spirits. Fats and oils: Butter. Canola oil. Vegetable oil. Beef fat (tallow). Lard. Sweets and desserts: Cookies. Cakes. Pies. Candy. Seasoning and other foods: Mayonnaise. Premade sauces and marinades. The items listed may not be a complete list. Talk with your dietitian about what dietary choices are right for you. Summary The Mediterranean diet includes both food and lifestyle choices. Eat a variety of fresh fruits and vegetables, beans, nuts, seeds, and whole grains. Limit the amount of red meat and sweets that you eat. Talk with your health care provider about whether it is safe for you to drink red wine in moderation. This means 1 glass a day for nonpregnant women and 2 glasses a day for men. A glass of wine equals 5 oz (150 mL). This information is not intended to replace advice given to you by your health care provider. Make sure you discuss any questions you have with your health care provider. Document Released: 01/30/2016 Document  Revised: 03/03/2016 Document Reviewed: 01/30/2016 Elsevier Interactive Patient Education  2017 [employer]     

## 2022-01-21 IMAGING — CR DG ORBITS FOR FOREIGN BODY
2 series · 2 of 2 positions shown · non-contrast
Comparison: None.

CLINICAL DATA: Metal working/exposure; clearance prior to MRI

EXAM:
ORBITS FOR FOREIGN BODY - 2 VIEW

[w orbit pa (1 of 2)]
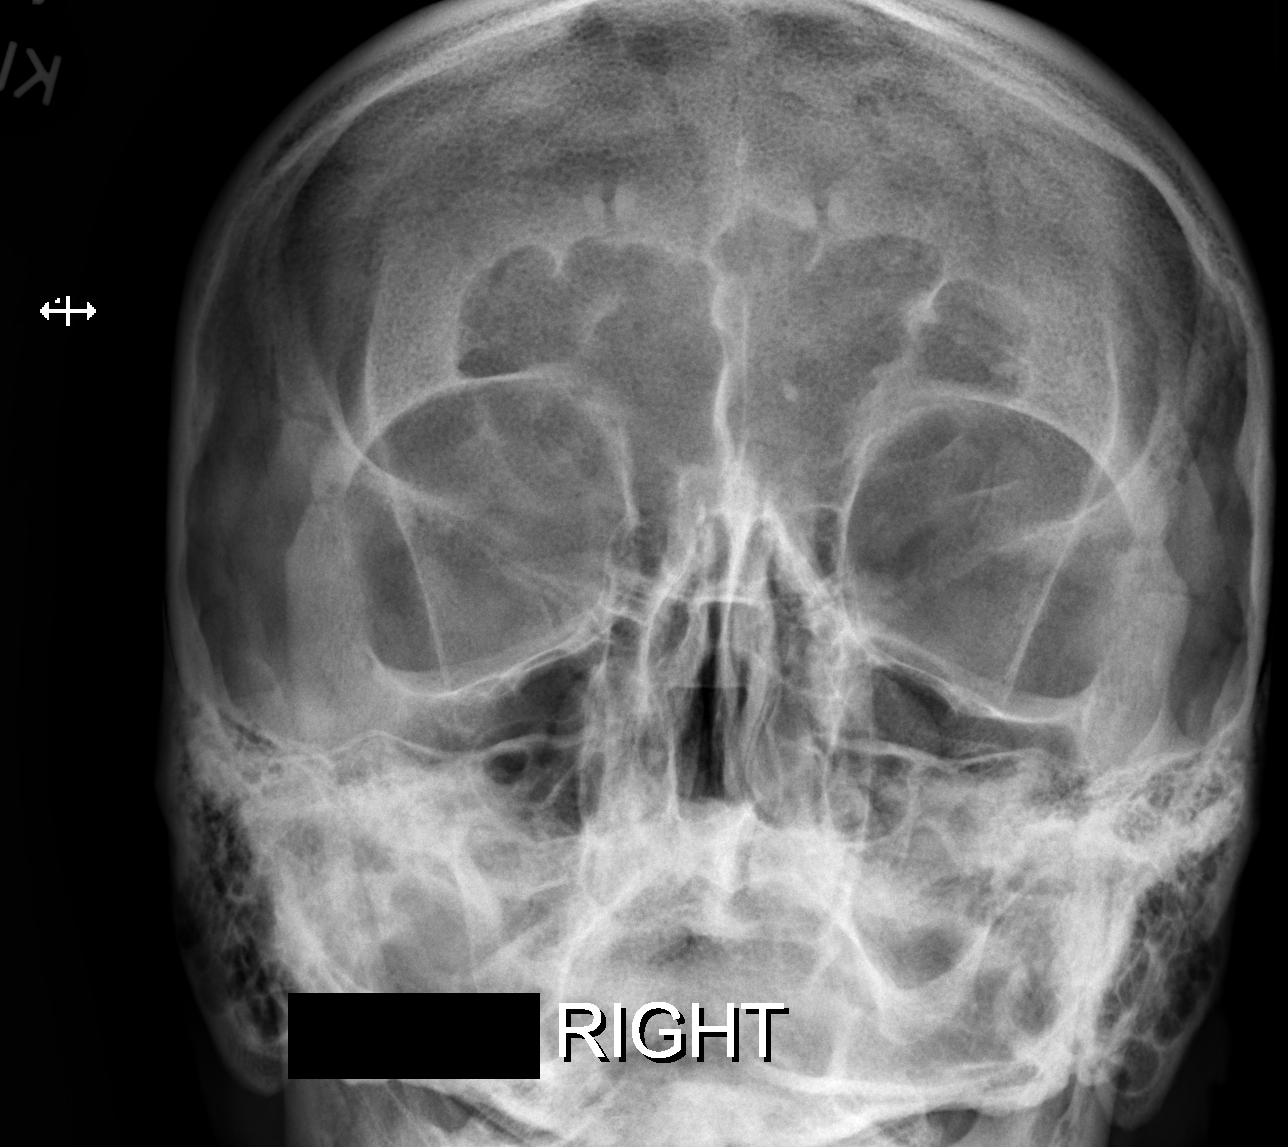

[w orbit pa (2 of 2)]
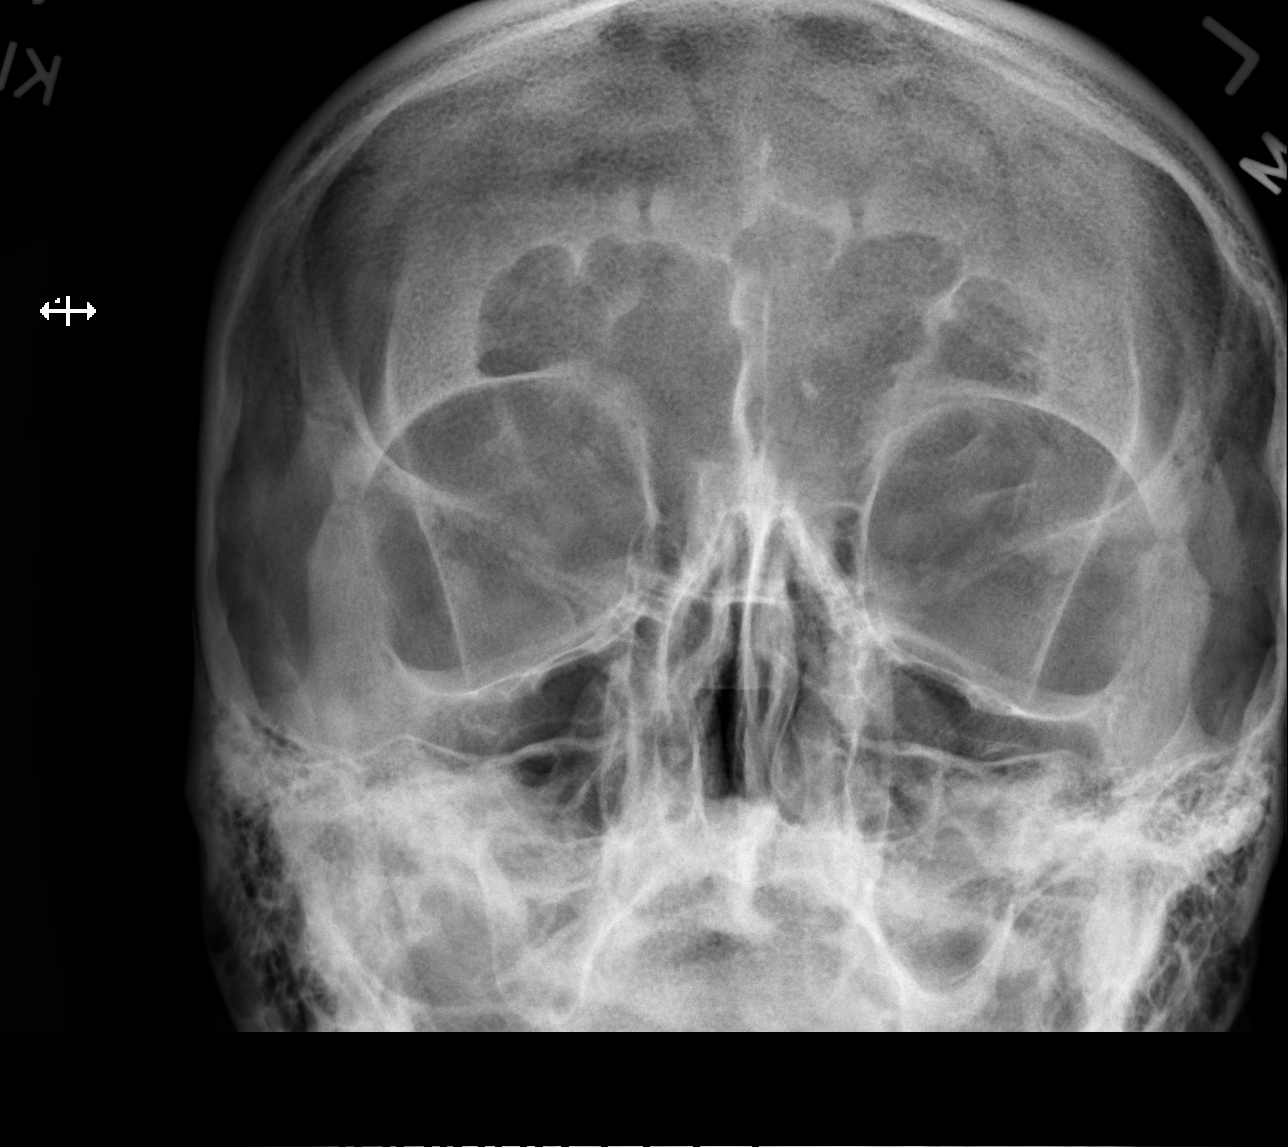

[2 of 2 positions shown; findings below may reference images not displayed]

FINDINGS: There is no evidence of metallic foreign body within the orbits. No
significant bone abnormality identified.
IMPRESSION: No evidence of metallic foreign body within the orbits.

## 2022-02-12 ENCOUNTER — Encounter: Payer: Self-pay | Admitting: Physician Assistant

## 2022-02-12 ENCOUNTER — Ambulatory Visit: Payer: Medicare PPO | Admitting: Physician Assistant

## 2022-02-12 VITALS — BP 127/79 | HR 63 | Ht 73.0 in | Wt 193.0 lb

## 2022-02-12 DIAGNOSIS — F819 Developmental disorder of scholastic skills, unspecified: Secondary | ICD-10-CM

## 2022-02-12 NOTE — Patient Instructions (Signed)
It was a pleasure to see you today at our office.   Recommendations:  Follow up In 1 year   Whom to call:  Memory  decline, memory medications: Call our office [phone]   For psychiatric meds, mood meds: Please have your primary care physician manage these medications.   Counseling regarding caregiver distress, including caregiver depression, anxiety and issues regarding community resources, adult day care programs, adult living facilities, or memory care questions:   Feel free to contact Misty Lisabeth Register, Social Worker at [phone]   For assessment of decision of mental capacity and competency:  Call Dr. Erick Blinks, geriatric psychiatrist at [phone]  For guidance in geriatric dementia issues please call [facility] [phone]  For guidance regarding [facility] and if placement were needed at the facility, contact Sidney Ace, Social Worker tel: [phone]  If you have any severe symptoms of a stroke, or other severe issues such as confusion,severe chills or fever, etc call 911 or go to the ER as you may need to be evaluated further     RECOMMENDATIONS FOR ALL PATIENTS WITH MEMORY PROBLEMS: 1. Continue to exercise (Recommend 30 minutes of walking everyday, or 3 hours every week) 2. Increase social interactions - continue going to [place] and enjoy social gatherings with friends and family 3. Eat healthy, avoid fried foods and eat more fruits and vegetables 4. Maintain adequate blood pressure, blood sugar, and blood cholesterol level. Reducing the risk of stroke and cardiovascular disease also helps promoting better memory. 5. Avoid stressful situations. Live a simple life and avoid aggravations. Organize your time and prepare for the next day in anticipation. 6. Sleep well, avoid any interruptions of sleep and avoid any distractions in the bedroom that may interfere with adequate sleep quality 7. Avoid sugar, avoid sweets as  there is a strong link between excessive sugar intake, diabetes, and cognitive impairment We discussed the Mediterranean diet, which has been shown to help patients reduce the risk of progressive memory disorders and reduces cardiovascular risk. This includes eating fish, eat fruits and green leafy vegetables, nuts like almonds and hazelnuts, walnuts, and also use olive oil. Avoid fast foods and fried foods as much as possible. Avoid sweets and sugar as sugar use has been linked to worsening of memory function.  There is always a concern of gradual progression of memory problems. If this is the case, then we may need to adjust level of care according to patient needs. Support, both to the patient and caregiver, should then be put into place.    FALL PRECAUTIONS: Be cautious when walking. Scan the area for obstacles that may increase the risk of trips and falls. When getting up in the mornings, sit up at the edge of the bed for a few minutes before getting out of bed. Consider elevating the bed at the head end to avoid drop of blood pressure when getting up. Walk always in a well-lit room (use night lights in the walls). Avoid area rugs or power cords from appliances in the middle of the walkways. Use a walker or a cane if necessary and consider physical therapy for balance exercise. Get your eyesight checked regularly.  FINANCIAL OVERSIGHT: Supervision, especially oversight when making financial decisions or transactions is also recommended.  HOME SAFETY: Consider the safety of the kitchen when operating appliances like stoves, microwave oven, and blender. Consider having supervision and share cooking responsibilities until no longer able to participate in those. Accidents with firearms and other  hazards in the house should be identified and addressed as well.   ABILITY TO BE LEFT ALONE: If patient is unable to contact 911 operator, consider using [facility], or when the need is there, arrange for someone  to stay with patients. Smoking is a fire hazard, consider supervision or cessation. Risk of wandering should be assessed by caregiver and if detected at any point, supervision and safe proof recommendations should be instituted.  MEDICATION SUPERVISION: Inability to self-administer medication needs to be constantly addressed. Implement a mechanism to ensure safe administration of the medications.   DRIVING: Regarding driving, in patients with progressive memory problems, driving will be impaired. We advise to have someone else do the driving if trouble finding directions or if minor accidents are reported. Independent driving assessment is available to determine safety of driving.   If you are interested in the driving assessment, you can contact the following:  [facility] in [place] [phone]  [facility] [phone]  [facility] [phone]  [facility] [phone] or [phone]

## 2022-02-12 NOTE — Progress Notes (Signed)
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 RH male a 71 y.o. male with a history of hypertension, hyperlipidemia, chronic gastritis without bleeding, prostate enlargement seen today in follow up for memory loss due to learning disability. MRI of the brain on 11/15/2020 was normal involving the morphology for age, with mild likely involutional change. MMSE today is  28 /30  with delayed recall  2/3 . He is stable from the cognitive standpoint    Recommendations:   Follow up in 1 year . No indication for antidementia medication     Subjective:   This patient is here alone.  Previous records as well as any outside records available were reviewed prior to todays visit.  Last MMSE was 25/30 in May 2022.    Any changes in memory since last visit? " Depends, sometimes I only remember half of the conversation" . Able to remember names and places. STM is "a little better" repeats oneself?  Denies  Patient lives with: wife  Disoriented when walking into a room?  Patient denies   Leaving objects in unusual places? Occasionally he forgets where he left his keys   Ambulates  with difficulty?   Patient denies   Recent falls?  Patient denies   Any head injuries?  Patient denies   History of seizures?   Patient denies   Wandering behavior?  Patient denies   Patient drives?   Endorsed, "my son is the copilot so I do not get lost " Any mood changes since last visit?  Patient denies   Any worsening depression?:  Patient denies   Hallucinations?  Patient denies   Paranoia?  Patient denies   Patient reports that  he sleeps well without vivid dreams, REM behavior or sleepwalking   History of sleep apnea?  Patient denies   Any hygiene concerns?  Patient denies   Independent of bathing and dressing?  Endorsed  Does the patient needs help with medications?  Denies Who is in charge of the finances? Him and his wife do it together  Any changes  in appetite?  Patient denies   Patient have trouble swallowing? Patient denies   Does the patient cook?  Patient denies   Any kitchen accidents such as leaving the stove on? Patient denies   Any headaches?  Patient denies   Double vision? Patient denies   Any focal numbness or tingling?  Patient denies   Chronic back pain Patient denies   Unilateral weakness?  Patient denies   Any tremors?  Patient denies   Any history of anosmia?  Patient denies   Any incontinence of urine?  Patient denies   Any bowel dysfunction?   Patient denies       MRI brain from 11/15/2020 that shows normal volume and morphology for age. There is mild, likely involutional change. No significant areas of leukoaraiosis though there is one minimal focus in the right frontal lobe as noted by radiology.       Initial Visit 10/29/20 The patient is seen in neurologic consultation at the request of Brandon Robert, MD from [facility] for the evaluation of memory.  The patient is accompanied by wife Brandon Copeland who supplements the history.  The patient is a 72 y.o. year old male with a history of hypertension, hyperlipidemia, chronic gastritis without bleeding, prostate enlargement per patient report, who has had memory issues for about 2 years after going out of town and  not remembering where he was, becoming very nervous;wife had to pick him up.  He has several similar episodes over the next year, for which his son began to be his "copilot".  There were no reported MVAs in the recent year or head trauma.  His wife of 40 years also noticed repetitive questions, or telling the same stories. She says that is "better to go along with this plan, from team as he may become more irritable".  She also noticed that he becomes fixated on one task, and he will not do another one till finishing the first.  For example, he enjoys mowing the grass, "will not do anything else".  This behavior has been fairly new over the last year.   He is otherwise able to perform his own ADLs.  He manages his own medications not forgetting or overdosing. He showers and dresses by himself without help.   The patient continues to do his finances.  He cooks but has been leaving stove on for the last year.  No difficulty remembering, and recipes.  He does not smoke or drink.  His appetite is normal.  No difficulty swallowing. He sleeps a significant amount of time and takes frequent naps during the day.  He denies any history of sleep apnea.  He denies any vivid dreams or sleepwalking. He is also worried about his erectile dysfunction, apparently related to prostate issues.  His PSA has been elevated, and he is to follow-up with urology regarding this, but he is "very upset and embarrassed that he has premature ejaculation for the last year, if at all".  He has urinary issues, sometimes retention and sometimes nocturia, he also has urine incontinence during the day.  He walks without any walker or cane. 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  Family history remarkable for one paternal aunt with Alzheimer's disease.  Denies headaches, injuries to the head, double vision, numbness or tingling or unilateral weakness. Denies tremors.    Neurocognitive Test Dr. Roseanne Reno 01/17/21  "Brandon Copeland is thus manifesting very significant premorbid limitations in academic skills that are likely to be developmental in nature and greatly confound interpretation of test performance. Mental status screening is likely to be misleading in the setting of these difficulties, particularly on more challenging measures such as the MoCA/SLUMS, would recommend use of the MMSE in the future. He may have some mild degree of cognitive impairment although I do not feel that the evidence is compelling enough for confident diagnosis given the patient's preexisting issues. He does not appear to have a clear memory storage problem. I will elicit further information  from his wife during the follow up but his son rates him as functioning at a normal level.    ...Brandon Copeland reported a mild level of symptoms associated with depression and a subclinical level of symptoms associated with anxiety. His son rated him as functioning at a normal (not even mild cognitive impairment level) on the quick dementia rating system. "   Past Medical History:  Diagnosis Date   Acid reflux    Hypertension      Past Surgical History:  Procedure Laterality Date   no surgerys       PREVIOUS MEDICATIONS:   CURRENT MEDICATIONS:  Outpatient Encounter Medications as of 02/12/2022  Medication Sig   atorvastatin (LIPITOR) 80 MG tablet 1 tablet   famotidine (PEPCID) 20 MG tablet 1 tablet at bedtime as needed   tamsulosin (FLOMAX) 0.4 MG CAPS  capsule 1 capsule   No facility-administered encounter medications on file as of 02/12/2022.     Objective:     PHYSICAL EXAMINATION:    VITALS:   Vitals:   02/12/22 0957  BP: 127/79  Pulse: 63  SpO2: 98%  Weight: 193 lb (87.5 kg)  Height: 6\' 1"  (1.854 m)    GEN:  The patient appears stated age and is in NAD. HEENT:  Normocephalic, atraumatic.   Neurological examination:  General: NAD, well-groomed, appears stated age. Orientation: The patient is alert. Oriented to person, place and date Cranial nerves: There is good facial symmetry.The speech is fluent and clear. No aphasia or dysarthria. Fund of knowledge is appropriate. Recent memory impaired and remote memory is normal.  Attention and concentration are normal.  Able to name objects and repeat phrases.  Hearing is intact to conversational tone.    Sensation: Sensation is intact to light touch throughout Motor: Strength is at least antigravity x4. Tremors: none  DTR's 2/4 in UE/LE      02/11/2021   10:00 AM 10/29/2020    9:00 AM  Montreal Cognitive Assessment   Visuospatial/ Executive (0/5) 0 4  Naming (0/3) 0 3  Attention: Read list of digits (0/2) 2 2   Attention: Read list of letters (0/1) 1 0  Attention: Serial 7 subtraction starting at 100 (0/3) 3 0  Language: Repeat phrase (0/2) 1 1  Language : Fluency (0/1) 1 0  Abstraction (0/2) 2 0  Delayed Recall (0/5) 0 2  Orientation (0/6) 0 2  Total 10 14       02/12/2022   10:00 AM 01/17/2021   10:00 AM  MMSE - Mini Mental State Exam  Orientation to time 5 5  Orientation to Place 5 4  Registration 3 3  Attention/ Calculation 5 4  Recall 2 2  Language- name 2 objects 2 2  Language- repeat 1 1  Language- follow 3 step command 3 2  Language- read & follow direction 1 1  Write a sentence 1 1  Copy design 0 0  Total score 28 25       Movement examination: Tone: There is normal tone in the UE/LE Abnormal movements:  no tremor.  No myoclonus.  No asterixis.   Coordination:  There is no decremation with RAM's. Normal finger to nose  Gait and Station: The patient has no difficulty arising out of a deep-seated chair without the use of the hands. The patient's stride length is good.  Gait is cautious and narrow.   Thank you for allowing 01/19/2021 the opportunity to participate in the care of this nice patient. Please do not hesitate to contact us for any questions or concerns.   Total time spent on today's visit was 25 minutes dedicated to this patient today, preparing to see patient, examining the patient, ordering tests and/or medications and counseling the patient, documenting clinical information in the EHR or other health record, independently interpreting results and communicating results to the patient/family, discussing treatment and goals, answering patient's questions and coordinating care.  Cc:  [place], MD  Brandon Copeland 02/12/2022 10:02 AM   Cc:  02/14/2022, MD Brandon Robert, PA-C

## 2023-02-15 ENCOUNTER — Ambulatory Visit: Payer: Medicare PPO | Admitting: Physician Assistant

## 2023-03-02 ENCOUNTER — Ambulatory Visit: Payer: Medicare PPO | Admitting: Physician Assistant

## 2023-03-02 ENCOUNTER — Encounter: Payer: Self-pay | Admitting: Physician Assistant

## 2023-03-02 VITALS — BP 141/84 | HR 76 | Resp 20 | Ht 73.0 in | Wt 190.0 lb

## 2023-03-02 DIAGNOSIS — F819 Developmental disorder of scholastic skills, unspecified: Secondary | ICD-10-CM

## 2023-03-02 NOTE — Patient Instructions (Signed)
It was a pleasure to see you today at our office.   Recommendations:  Follow up In 1 year   Whom to call:  Memory  decline, memory medications: Call our office [phone]   For psychiatric meds, mood meds: Please have your primary care physician manage these medications.   Counseling regarding caregiver distress, including caregiver depression, anxiety and issues regarding community resources, adult day care programs, adult living facilities, or memory care questions:   Feel free to contact Misty Lisabeth Register, Social Worker at [phone]   For assessment of decision of mental capacity and competency:  Call Dr. Erick Blinks, geriatric psychiatrist at [phone]  For guidance in geriatric dementia issues please call [facility] [phone]  For guidance regarding [facility] and if placement were needed at the facility, contact Sidney Ace, Social Worker tel: [phone]  If you have any severe symptoms of a stroke, or other severe issues such as confusion,severe chills or fever, etc call 911 or go to the ER as you may need to be evaluated further     RECOMMENDATIONS FOR ALL PATIENTS WITH MEMORY PROBLEMS: 1. Continue to exercise (Recommend 30 minutes of walking everyday, or 3 hours every week) 2. Increase social interactions - continue going to [place] and enjoy social gatherings with friends and family 3. Eat healthy, avoid fried foods and eat more fruits and vegetables 4. Maintain adequate blood pressure, blood sugar, and blood cholesterol level. Reducing the risk of stroke and cardiovascular disease also helps promoting better memory. 5. Avoid stressful situations. Live a simple life and avoid aggravations. Organize your time and prepare for the next day in anticipation. 6. Sleep well, avoid any interruptions of sleep and avoid any distractions in the bedroom that may interfere with adequate sleep quality 7. Avoid sugar, avoid sweets as  there is a strong link between excessive sugar intake, diabetes, and cognitive impairment We discussed the Mediterranean diet, which has been shown to help patients reduce the risk of progressive memory disorders and reduces cardiovascular risk. This includes eating fish, eat fruits and green leafy vegetables, nuts like almonds and hazelnuts, walnuts, and also use olive oil. Avoid fast foods and fried foods as much as possible. Avoid sweets and sugar as sugar use has been linked to worsening of memory function.  There is always a concern of gradual progression of memory problems. If this is the case, then we may need to adjust level of care according to patient needs. Support, both to the patient and caregiver, should then be put into place.    FALL PRECAUTIONS: Be cautious when walking. Scan the area for obstacles that may increase the risk of trips and falls. When getting up in the mornings, sit up at the edge of the bed for a few minutes before getting out of bed. Consider elevating the bed at the head end to avoid drop of blood pressure when getting up. Walk always in a well-lit room (use night lights in the walls). Avoid area rugs or power cords from appliances in the middle of the walkways. Use a walker or a cane if necessary and consider physical therapy for balance exercise. Get your eyesight checked regularly.  FINANCIAL OVERSIGHT: Supervision, especially oversight when making financial decisions or transactions is also recommended.  HOME SAFETY: Consider the safety of the kitchen when operating appliances like stoves, microwave oven, and blender. Consider having supervision and share cooking responsibilities until no longer able to participate in those. Accidents with firearms and other  hazards in the house should be identified and addressed as well.   ABILITY TO BE LEFT ALONE: If patient is unable to contact 911 operator, consider using [facility], or when the need is there, arrange for someone  to stay with patients. Smoking is a fire hazard, consider supervision or cessation. Risk of wandering should be assessed by caregiver and if detected at any point, supervision and safe proof recommendations should be instituted.  MEDICATION SUPERVISION: Inability to self-administer medication needs to be constantly addressed. Implement a mechanism to ensure safe administration of the medications.   DRIVING: Regarding driving, in patients with progressive memory problems, driving will be impaired. We advise to have someone else do the driving if trouble finding directions or if minor accidents are reported. Independent driving assessment is available to determine safety of driving.   If you are interested in the driving assessment, you can contact the following:  [facility] in [place] [phone]  [facility] [phone]  [facility] [phone]  [facility] [phone] or [phone]

## 2023-03-02 NOTE — Progress Notes (Signed)
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  Brandon Copeland is a very pleasant 72 y.o. 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  Memory stable, his MMSE today is 29/30.  He continues to participate in his ADLs without difficulty and continues to drive with a copilot.  He is not on antidementia medication, this is not indicated.      Follow up in 1 year Recommend good control of her cardiovascular risk factors Continue to control mood as per PCP     Subjective:    This patient is accompanied in the office by his son who supplements the history.  Previous records as well as any outside records available were reviewed prior to todays visit. Patient was last seen on September 2023 with MMSE of 28/30.    Any changes in memory since last visit? "  Depends, sometimes I only remember parts of the conversation".  He is able to remember names and places.  Long-term memory is good. repeats oneself?  Endorsed Disoriented when walking into a room?  Patient denies    Leaving objects in unusual places?  May misplace things but not in unusual places   Wandering behavior?  denies   Any personality changes since last visit?  denies   Any worsening depression?:  Denies.   Hallucinations or paranoia?  Denies.   Seizures? denies    Any sleep changes?  Sleeps well.  Denies vivid dreams, REM behavior or sleepwalking   Sleep apnea?   Denies.   Any hygiene concerns? Denies.  Independent of bathing and dressing?  Endorsed  Does the patient needs help with medications?  Patient and his wife are in charge together  Who is in charge of the finances?  Patient and his wife do the finances together.  Any changes in appetite?  Denies.     Patient have trouble swallowing? Denies.   Does the patient cook? No Any headaches?   denies   Chronic back pain  denies   Ambulates with difficulty? Denies.     Recent falls or head injuries? denies     Unilateral weakness, numbness or tingling? denies   Any tremors?  Denies   Any anosmia?  Denies   Any incontinence of urine? Denies  Any bowel dysfunction?   Denies      Patient lives with his wife  Does the patient drive?  Yes, son is copilot , "so I never get lost ".   MRI of the brain personally reviewed from 11/15/2020 was normal involving the morphology for age, with mild likely involutional change.     Initial Visit 10/29/20 The patient is seen in neurologic consultation at the request of Brandon Robert, MD from [facility] for the evaluation of memory.  The patient is accompanied by wife Brandon Copeland who supplements the history.  The patient is a 72 y.o. year old male with a history of hypertension, hyperlipidemia, chronic gastritis without bleeding, prostate enlargement per patient report, who has had memory issues for about 2 years after going out of town and not remembering where he was, becoming very nervous;wife had to pick him up.  He has several similar episodes over the next year, for which his son began to be his "copilot".  There were no reported MVAs in the recent year or head trauma.  His wife of 40 years also noticed repetitive questions, or telling the  same stories. She says that is "better to go along with this plan, from team as he may become more irritable".  She also noticed that he becomes fixated on one task, and he will not do another one till finishing the first.  For example, he enjoys mowing the grass, "will not do anything else".  This behavior has been fairly new over the last year.  He is otherwise able to perform his own ADLs.  He manages his own medications not forgetting or overdosing. He showers and dresses by himself without help.   The patient continues to do his finances.  He cooks but has been leaving stove on for the last year.  No difficulty remembering, and recipes.  He does not smoke or drink.  His  appetite is normal.  No difficulty swallowing. He sleeps a significant amount of time and takes frequent naps during the day.  He denies any history of sleep apnea.  He denies any vivid dreams or sleepwalking. He is also worried about his erectile dysfunction, apparently related to prostate issues.  His PSA has been elevated, and he is to follow-up with urology regarding this, but he is "very upset and embarrassed that he has premature ejaculation for the last year, if at all".  He has urinary issues, sometimes retention and sometimes nocturia, he also has urine incontinence during the day.  He walks without any walker or cane. 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  Family history remarkable for one paternal aunt with Alzheimer's disease.  Denies headaches, injuries to the head, double vision, numbness or tingling or unilateral weakness. Denies tremors.    Neurocognitive Test Brandon Copeland 01/17/21  "Brandon Copeland is thus manifesting very significant premorbid limitations in academic skills that are likely to be developmental in nature and greatly confound interpretation of test performance. Mental status screening is likely to be misleading in the setting of these difficulties, particularly on more challenging measures such as the MoCA/SLUMS, would recommend use of the MMSE in the future. He may have some mild degree of cognitive impairment although I do not feel that the evidence is compelling enough for confident diagnosis given the patient's preexisting issues. He does not appear to have a clear memory storage problem. I will elicit further information from his wife during the follow up but his son rates him as functioning at a normal level.    ...Brandon Copeland reported a mild level of symptoms associated with depression and a subclinical level of symptoms associated with anxiety. His son rated him as functioning at a normal (not even mild cognitive impairment level) on the quick  dementia rating system. "       PREVIOUS MEDICATIONS:   CURRENT MEDICATIONS:  Outpatient Encounter Medications as of 03/02/2023  Medication Sig   atorvastatin (LIPITOR) 80 MG tablet 1 tablet   famotidine (PEPCID) 20 MG tablet 1 tablet at bedtime as needed   tamsulosin (FLOMAX) 0.4 MG CAPS capsule 1 capsule   No facility-administered encounter medications on file as of 03/02/2023.       03/03/2023    6:00 PM 02/12/2022   10:00 AM 01/17/2021   10:00 AM  MMSE - Mini Mental State Exam  Orientation to time 5 5 5   Orientation to Place 5 5 4   Registration 3 3 3   Attention/ Calculation 5 5 4   Recall 3 2 2   Language- name 2 objects 2 2 2   Language- repeat 1 1 1   Language- follow  3 step command 3 3 2   Language- read & follow direction 1 1 1   Write a sentence 1 1 1   Copy design 0 0 0  Total score 29 28 25       02/11/2021   10:00 AM 10/29/2020    9:00 AM  Montreal Cognitive Assessment   Visuospatial/ Executive (0/5) 0 4  Naming (0/3) 0 3  Attention: Read list of digits (0/2) 2 2  Attention: Read list of letters (0/1) 1 0  Attention: Serial 7 subtraction starting at 100 (0/3) 3 0  Language: Repeat phrase (0/2) 1 1  Language : Fluency (0/1) 1 0  Abstraction (0/2) 2 0  Delayed Recall (0/5) 0 2  Orientation (0/6) 0 2  Total 10 14    Objective:     PHYSICAL EXAMINATION:    VITALS:   Vitals:   03/02/23 1452  BP: (!) 141/84  Pulse: 76  Resp: 20  SpO2: 99%  Weight: 190 lb (86.2 kg)  Height: 6\' 1"  (1.854 m)    GEN:  The patient appears stated age and is in NAD. HEENT:  Normocephalic, atraumatic.   Neurological examination:  General: NAD, well-groomed, appears stated age. Orientation: The patient is alert. Oriented to person, place and date Cranial nerves: There is good facial symmetry.The speech is fluent and clear. No aphasia or dysarthria. Fund of knowledge is appropriate. Recent memory is impaired, remote memory is normal. Attention and concentration are normal.   Able to name objects and repeat phrases.  Hearing is intact to conversational tone.   Sensation: Sensation is intact to light touch throughout Motor: Strength is at least antigravity x4. DTR's 2/4 in UE/LE     Movement examination: Tone: There is normal tone in the UE/LE Abnormal movements:  no tremor.  No myoclonus.  No asterixis.   Coordination:  There is no decremation with RAM's. Normal finger to nose  Gait and Station: The patient has no difficulty arising out of a deep-seated chair without the use of the hands. The patient's stride length is good.  Gait is cautious and narrow.    Thank you for allowing [place] the opportunity to participate in the care of this nice patient. Please do not hesitate to contact us for any questions or concerns.   Total time spent on today's visit was 20 minutes dedicated to this patient today, preparing to see patient, examining the patient, ordering tests and/or medications and counseling the patient, documenting clinical information in the EHR or other health record, independently interpreting results and communicating results to the patient/family, discussing treatment and goals, answering patient's questions and coordinating care.  Cc:  Brandon Robert, MD  Marlowe Kays 03/03/2023 6:42 PM

## 2024-01-20 ENCOUNTER — Ambulatory Visit (INDEPENDENT_AMBULATORY_CARE_PROVIDER_SITE_OTHER): Admitting: Physician Assistant

## 2024-01-20 ENCOUNTER — Encounter: Payer: Self-pay | Admitting: Physician Assistant

## 2024-01-20 VITALS — BP 120/72 | HR 72 | Resp 20 | Ht 73.0 in | Wt 200.0 lb

## 2024-01-20 DIAGNOSIS — F819 Developmental disorder of scholastic skills, unspecified: Secondary | ICD-10-CM

## 2024-01-20 NOTE — Patient Instructions (Signed)
 It was a pleasure to see you today at our office.   Recommendations:  Follow up In 1 year  Recommend hearing evaluation to improve comprehension   Whom to call:       RECOMMENDATIONS FOR ALL PATIENTS WITH MEMORY PROBLEMS: 1. Continue to exercise (Recommend 30 minutes of walking everyday, or 3 hours every week) 2. Increase social interactions - continue going to [place] and enjoy social gatherings with friends and family 3. Eat healthy, avoid fried foods and eat more fruits and vegetables 4. Maintain adequate blood pressure, blood sugar, and blood cholesterol level. Reducing the risk of stroke and cardiovascular disease also helps promoting better memory. 5. Avoid stressful situations. Live a simple life and avoid aggravations. Organize your time and prepare for the next day in anticipation. 6. Sleep well, avoid any interruptions of sleep and avoid any distractions in the bedroom that may interfere with adequate sleep quality 7. Avoid sugar, avoid sweets as there is a strong link between excessive sugar intake, diabetes, and cognitive impairment We discussed the Mediterranean diet, which has been shown to help patients reduce the risk of progressive memory disorders and reduces cardiovascular risk. This includes eating fish, eat fruits and green leafy vegetables, nuts like almonds and hazelnuts, walnuts, and also use olive oil. Avoid fast foods and fried foods as much as possible. Avoid sweets and sugar as sugar use has been linked to worsening of memory function.  There is always a concern of gradual progression of memory problems. If this is the case, then we may need to adjust level of care according to patient needs. Support, both to the patient and caregiver, should then be put into place.    FALL PRECAUTIONS: Be cautious when walking. Scan the area for obstacles that may increase the risk of trips and falls. When getting up in the mornings, sit up at the edge of the bed for a few minutes  before getting out of bed. Consider elevating the bed at the head end to avoid drop of blood pressure when getting up. Walk always in a well-lit room (use night lights in the walls). Avoid area rugs or power cords from appliances in the middle of the walkways. Use a walker or a cane if necessary and consider physical therapy for balance exercise. Get your eyesight checked regularly.  FINANCIAL OVERSIGHT: Supervision, especially oversight when making financial decisions or transactions is also recommended.  HOME SAFETY: Consider the safety of the kitchen when operating appliances like stoves, microwave oven, and blender. Consider having supervision and share cooking responsibilities until no longer able to participate in those. Accidents with firearms and other hazards in the house should be identified and addressed as well.   ABILITY TO BE LEFT ALONE: If patient is unable to contact 911 operator, consider using LifeLine, or when the need is there, arrange for someone to stay with patients. Smoking is a fire hazard, consider supervision or cessation. Risk of wandering should be assessed by caregiver and if detected at any point, supervision and safe proof recommendations should be instituted.  MEDICATION SUPERVISION: Inability to self-administer medication needs to be constantly addressed. Implement a mechanism to ensure safe administration of the medications.   DRIVING: Regarding driving, in patients with progressive memory problems, driving will be impaired. We advise to have someone else do the driving if trouble finding directions or if minor accidents are reported. Independent driving assessment is available to determine safety of driving.   If you are interested in the driving assessment,  you can contact the following:  [employer] in [place] [phone]  [facility] [phone]  [facility] [phone]  [facility] [phone] or  [phone]

## 2024-01-20 NOTE — Progress Notes (Signed)
 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 RH male with a history of HTN, HLD, chronic gastritis without bleeding, BPH,  presenting today in follow-up for evaluation of memory loss . Memory is stable, with MMSE today of 28/30. He continues to participate in his ADLs without difficulties and continues to drive with a copilot.  He is not on antidementia medication, as this is not indicated at this time. Mood is good.    Recommendations:   Follow up in 1 year  No indication for antidementia medication at this time Recommend good control of cardiovascular risk factors Continue to control mood as per PCP Recommend hearing evaluation to improve comprehension     Subjective:   This patient is here alone.  Previous records as well as any outside records available were reviewed prior to todays visit.   Patient was last seen on 03/02/2023 with MMSE 29/30.    Any changes in memory since last visit? About the same.  Sometimes he remembers all the parts of the conversation.  He is able to remember names and places.  LTM is good. repeats oneself?  Endorsed, not often Disoriented when walking into a room?  Patient denies    Misplacing objects?  Endorsed, occasionally, such as the keys, but not in unusual places  Wandering behavior?   Denies. Any personality changes since last visit? Denies.   Any worsening depression?: denies.   Hallucinations or paranoia?  Denies.   Seizures?   Denies.    Any sleep changes? Sleeps well.  Denies nightmares,  REM behavior or sleepwalking   Sleep apnea?   denies   Any hygiene concerns?   Denies.   Independent of bathing and dressing?  Endorsed  Does the patient needs help with medications? Patient is in charge   Who is in charge of the finances?  Patient and his wife are  in charge      Any changes in appetite?  Denies. I eat well     Patient have trouble swallowing?  Denies.   Does the  patient cook?  Yes, denies forgetting his own recipes, Any kitchen accidents such as leaving the stove on?   Denies.   Any headaches?    Denies.   Vision changes? Denies.  Chronic pain?  Denies.   Ambulates with difficulty?    Denies.    Recent falls or head injuries?    Denies.      Unilateral weakness, numbness or tingling?  Denies.   Any tremors?  Denies.   Any anosmia?    Denies.   Any incontinence of urine?  Denies.   Any bowel dysfunction?  Denies.      Patient lives with his wife.  Does the patient drive?  Yes, with son as a copilot    MRI of the brain personally reviewed from 11/15/2020 was normal involving the morphology for age, with mild likely involutional change.       Initial Visit 10/29/20 The patient is seen in neurologic consultation at the request of Brandon Senior, MD from [facility] for the evaluation of memory.  The patient is accompanied by wife Brandon Copeland who supplements the history.  The patient is a 73 y.o. year old male with a history of hypertension, hyperlipidemia, chronic gastritis without bleeding, prostate enlargement per patient report, who has had memory issues for about 2 years after going out of town and not remembering  where he was, becoming very nervous;wife had to pick him up.  He has several similar episodes over the next year, for which his son began to be his copilot.  There were no reported MVAs in the recent year or head trauma.  His wife of 40 years also noticed repetitive questions, or telling the same stories. She says that is better to go along with this plan, from team as he may become more irritable.  She also noticed that he becomes fixated on one task, and he will not do another one till finishing the first.  For example, he enjoys mowing the grass, will not do anything else.  This behavior has been fairly new over the last year.  He is otherwise able to perform his own ADLs.  He manages his own medications not forgetting or  overdosing. He showers and dresses by himself without help.   The patient continues to do his finances.  He cooks but has been leaving stove on for the last year.  No difficulty remembering, and recipes.  He does not smoke or drink.  His appetite is normal.  No difficulty swallowing. He sleeps a significant amount of time and takes frequent naps during the day.  He denies any history of sleep apnea.  He denies any vivid dreams or sleepwalking. He is also worried about his erectile dysfunction, apparently related to prostate issues.  His PSA has been elevated, and he is to follow-up with urology regarding this, but he is very upset and embarrassed that he has premature ejaculation for the last year, if at all.  He has urinary issues, sometimes retention and sometimes nocturia, he also has urine incontinence during the day.  He walks without any walker or cane. 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  Family history remarkable for one paternal aunt with Alzheimer's disease.  Denies headaches, injuries to the head, double vision, numbness or tingling or unilateral weakness. Denies tremors.    Neurocognitive Test Brandon Copeland 01/17/21  Brandon Copeland is thus manifesting very significant premorbid limitations in academic skills that are likely to be developmental in nature and greatly confound interpretation of test performance. Mental status screening is likely to be misleading in the setting of these difficulties, particularly on more challenging measures such as the MoCA/SLUMS, would recommend use of the MMSE in the future. He may have some mild degree of cognitive impairment although I do not feel that the evidence is compelling enough for confident diagnosis given the patient's preexisting issues. He does not appear to have a clear memory storage problem. I will elicit further information from his wife during the follow up but his son rates him as functioning at a normal level.     ...Brandon Copeland reported a mild level of symptoms associated with depression and a subclinical level of symptoms associated with anxiety. His son rated him as functioning at a normal (not even mild cognitive impairment level) on the quick dementia rating system.   Past Medical History:  Diagnosis Date   Acid reflux    Hypertension      Past Surgical History:  Procedure Laterality Date   no surgerys       PREVIOUS MEDICATIONS:   CURRENT MEDICATIONS:  Outpatient Encounter Medications as of 01/20/2024  Medication Sig   atorvastatin (LIPITOR) 80 MG tablet 1 tablet   famotidine (PEPCID) 20 MG tablet 1 tablet at bedtime as needed   tamsulosin (FLOMAX) 0.4 MG CAPS capsule 1 capsule  No facility-administered encounter medications on file as of 01/20/2024.     Objective:     PHYSICAL EXAMINATION:    VITALS:   Vitals:   01/20/24 1051  BP: 120/72  Pulse: 72  Resp: 20  SpO2: 95%  Weight: 200 lb (90.7 kg)  Height: 6' 1 (1.854 m)    GEN:  The patient appears stated age and is in NAD. HEENT:  Normocephalic, atraumatic.   Neurological examination:  General: NAD, well-groomed, appears stated age. Orientation: The patient is alert. Oriented to person, place and  to date. Cranial nerves: There is good facial symmetry.The speech is fluent and clear. No aphasia or dysarthria. Fund of knowledge is appropriate. Recent memory impaired and remote memory is normal.  Attention and concentration are normal.  Able to name objects and repeat phrases.  Hearing is slightly decreased to conversational tone.    Sensation: Sensation is intact to light touch throughout Motor: Strength is at least antigravity x4. DTR's 2/4 in UE/LE      02/11/2021   10:00 AM 10/29/2020    9:00 AM  Montreal Cognitive Assessment   Visuospatial/ Executive (0/5) 0 4  Naming (0/3) 0 3  Attention: Read list of digits (0/2) 2 2  Attention: Read list of letters (0/1) 1 0  Attention: Serial 7 subtraction starting at  100 (0/3) 3 0  Language: Repeat phrase (0/2) 1 1  Language : Fluency (0/1) 1 0  Abstraction (0/2) 2 0  Delayed Recall (0/5) 0 2  Orientation (0/6) 0 2  Total 10 14       01/20/2024   11:00 AM 03/03/2023    6:00 PM 02/12/2022   10:00 AM  MMSE - Mini Mental State Exam  Orientation to time 5 5 5   Orientation to Place 5 5 5   Registration 3 3 3   Attention/ Calculation 4 5 5   Recall 3 3 2   Language- name 2 objects 2 2 2   Language- repeat 1 1 1   Language- follow 3 step command 3 3 3   Language- read & follow direction 1 1 1   Write a sentence 1 1 1   Copy design 0 0 0  Total score 28 29 28        Movement examination: Tone: There is normal tone in the UE/LE Abnormal movements:  no tremor.  No myoclonus.  No asterixis.   Coordination:  There is no decremation with RAM's. Normal finger to nose  Gait and Station: The patient has no difficulty arising out of a deep-seated chair without the use of the hands. The patient's stride length is good.  Gait is cautious and narrow.   Thank you for allowing us  the opportunity to participate in the care of this nice patient. Please do not hesitate to contact us  for any questions or concerns.   Total time spent on today's visit was 20 minutes dedicated to this patient today, preparing to see patient, examining the patient, ordering tests and/or medications and counseling the patient, documenting clinical information in the EHR or other health record, independently interpreting results and communicating results to the patient/family, discussing treatment and goals, answering patient's questions and coordinating care.  Cc:  Brandon Senior, MD  Camie Sevin 01/20/2024 11:08 AM

## 2024-03-01 ENCOUNTER — Ambulatory Visit: Payer: Medicare PPO | Admitting: Physician Assistant

## 2025-01-19 ENCOUNTER — Ambulatory Visit: Admitting: Physician Assistant
